# Patient Record
Sex: Female | Born: 1935 | Race: Black or African American | Hispanic: No | Marital: Single | Smoking: Never smoker
Health system: Southern US, Community
[De-identification: ages and names within clinical notes are randomized; demographics above are authoritative.]

## PROBLEM LIST (undated history)

## (undated) DIAGNOSIS — I700 Atherosclerosis of aorta: Secondary | ICD-10-CM

## (undated) DIAGNOSIS — M1990 Unspecified osteoarthritis, unspecified site: Secondary | ICD-10-CM

## (undated) DIAGNOSIS — I10 Essential (primary) hypertension: Secondary | ICD-10-CM

## (undated) DIAGNOSIS — I5030 Unspecified diastolic (congestive) heart failure: Secondary | ICD-10-CM

## (undated) DIAGNOSIS — E039 Hypothyroidism, unspecified: Secondary | ICD-10-CM

## (undated) HISTORY — PX: MULTIPLE TOOTH EXTRACTIONS: SHX2053

---

## 1999-10-13 ENCOUNTER — Other Ambulatory Visit: Admission: RE | Admit: 1999-10-13 | Discharge: 1999-10-13 | Payer: Self-pay | Admitting: Obstetrics and Gynecology

## 1999-10-15 ENCOUNTER — Other Ambulatory Visit: Admission: RE | Admit: 1999-10-15 | Discharge: 1999-10-15 | Payer: Self-pay | Admitting: *Deleted

## 2000-04-16 ENCOUNTER — Ambulatory Visit (HOSPITAL_COMMUNITY): Admission: RE | Admit: 2000-04-16 | Discharge: 2000-04-16 | Payer: Self-pay | Admitting: *Deleted

## 2004-09-18 ENCOUNTER — Encounter: Admission: RE | Admit: 2004-09-18 | Discharge: 2004-09-18 | Payer: Self-pay | Admitting: Internal Medicine

## 2017-08-01 ENCOUNTER — Emergency Department (HOSPITAL_COMMUNITY): Payer: Medicare Other

## 2017-08-01 ENCOUNTER — Encounter (HOSPITAL_COMMUNITY): Payer: Self-pay | Admitting: Emergency Medicine

## 2017-08-01 ENCOUNTER — Inpatient Hospital Stay (HOSPITAL_COMMUNITY)
Admission: EM | Admit: 2017-08-01 | Discharge: 2017-08-03 | DRG: 292 | Disposition: A | Discharge status: Home / Self Care | Payer: Medicare Other | Attending: Oncology | Admitting: Oncology

## 2017-08-01 DIAGNOSIS — M7989 Other specified soft tissue disorders: Secondary | ICD-10-CM

## 2017-08-01 DIAGNOSIS — M1990 Unspecified osteoarthritis, unspecified site: Secondary | ICD-10-CM | POA: Diagnosis present

## 2017-08-01 DIAGNOSIS — M19012 Primary osteoarthritis, left shoulder: Secondary | ICD-10-CM | POA: Diagnosis present

## 2017-08-01 DIAGNOSIS — J849 Interstitial pulmonary disease, unspecified: Secondary | ICD-10-CM | POA: Diagnosis present

## 2017-08-01 DIAGNOSIS — I110 Hypertensive heart disease with heart failure: Principal | ICD-10-CM | POA: Diagnosis present

## 2017-08-01 DIAGNOSIS — Z993 Dependence on wheelchair: Secondary | ICD-10-CM

## 2017-08-01 DIAGNOSIS — M19011 Primary osteoarthritis, right shoulder: Secondary | ICD-10-CM | POA: Diagnosis present

## 2017-08-01 DIAGNOSIS — I10 Essential (primary) hypertension: Secondary | ICD-10-CM

## 2017-08-01 DIAGNOSIS — I480 Paroxysmal atrial fibrillation: Secondary | ICD-10-CM | POA: Diagnosis not present

## 2017-08-01 DIAGNOSIS — I700 Atherosclerosis of aorta: Secondary | ICD-10-CM | POA: Diagnosis not present

## 2017-08-01 DIAGNOSIS — I5033 Acute on chronic diastolic (congestive) heart failure: Secondary | ICD-10-CM | POA: Diagnosis present

## 2017-08-01 DIAGNOSIS — Z87891 Personal history of nicotine dependence: Secondary | ICD-10-CM

## 2017-08-01 DIAGNOSIS — R600 Localized edema: Secondary | ICD-10-CM

## 2017-08-01 DIAGNOSIS — E039 Hypothyroidism, unspecified: Secondary | ICD-10-CM | POA: Diagnosis present

## 2017-08-01 DIAGNOSIS — E785 Hyperlipidemia, unspecified: Secondary | ICD-10-CM | POA: Diagnosis present

## 2017-08-01 DIAGNOSIS — Z7982 Long term (current) use of aspirin: Secondary | ICD-10-CM

## 2017-08-01 DIAGNOSIS — R079 Chest pain, unspecified: Secondary | ICD-10-CM | POA: Diagnosis present

## 2017-08-01 DIAGNOSIS — I2510 Atherosclerotic heart disease of native coronary artery without angina pectoris: Secondary | ICD-10-CM | POA: Diagnosis present

## 2017-08-01 DIAGNOSIS — D649 Anemia, unspecified: Secondary | ICD-10-CM | POA: Diagnosis present

## 2017-08-01 DIAGNOSIS — I272 Pulmonary hypertension, unspecified: Secondary | ICD-10-CM | POA: Diagnosis present

## 2017-08-01 DIAGNOSIS — I5031 Acute diastolic (congestive) heart failure: Secondary | ICD-10-CM | POA: Diagnosis present

## 2017-08-01 DIAGNOSIS — Z791 Long term (current) use of non-steroidal anti-inflammatories (NSAID): Secondary | ICD-10-CM

## 2017-08-01 HISTORY — DX: Atherosclerosis of aorta: I70.0

## 2017-08-01 HISTORY — DX: Essential (primary) hypertension: I10

## 2017-08-01 HISTORY — DX: Unspecified osteoarthritis, unspecified site: M19.90

## 2017-08-01 LAB — BASIC METABOLIC PANEL
ANION GAP: 10 (ref 5–15)
BUN: 17 mg/dL (ref 6–20)
CALCIUM: 9.9 mg/dL (ref 8.9–10.3)
CO2: 27 mmol/L (ref 22–32)
Chloride: 104 mmol/L (ref 101–111)
Creatinine, Ser: 0.51 mg/dL (ref 0.44–1.00)
GFR calc non Af Amer: >60 mL/min (ref >60)
Glucose, Bld: 83 mg/dL (ref 65–99)
Potassium: 3.3 mmol/L — ABNORMAL LOW (ref 3.5–5.1)
SODIUM: 141 mmol/L (ref 135–145)

## 2017-08-01 LAB — URINALYSIS, ROUTINE W REFLEX MICROSCOPIC
BILIRUBIN URINE: NEGATIVE
Glucose, UA: NEGATIVE mg/dL
Hgb urine dipstick: NEGATIVE
KETONES UR: 15 mg/dL — AB
LEUKOCYTES UA: NEGATIVE
NITRITE: NEGATIVE
PH: 5.5 (ref 5.0–8.0)
PROTEIN: NEGATIVE mg/dL
Specific Gravity, Urine: 1.015 (ref 1.005–1.030)

## 2017-08-01 LAB — CBC WITH DIFFERENTIAL/PLATELET
BASOS ABS: 0 10*3/uL (ref 0.0–0.1)
BASOS PCT: 1 %
EOS ABS: 0.3 10*3/uL (ref 0.0–0.7)
Eosinophils Relative: 6 %
HEMATOCRIT: 31.9 % — AB (ref 36.0–46.0)
HEMOGLOBIN: 10.4 g/dL — AB (ref 12.0–15.0)
LYMPHS ABS: 1.5 10*3/uL (ref 0.7–4.0)
Lymphocytes Relative: 26 %
MCH: 30.5 pg (ref 26.0–34.0)
MCHC: 32.6 g/dL (ref 30.0–36.0)
MCV: 93.5 fL (ref 78.0–100.0)
MONO ABS: 0.6 10*3/uL (ref 0.1–1.0)
MONOS PCT: 10 %
NEUTROS PCT: 57 %
Neutro Abs: 3.4 10*3/uL (ref 1.7–7.7)
Platelets: 407 10*3/uL — ABNORMAL HIGH (ref 150–400)
RBC: 3.41 MIL/uL — ABNORMAL LOW (ref 3.87–5.11)
RDW: 16.4 % — AB (ref 11.5–15.5)
WBC: 5.9 10*3/uL (ref 4.0–10.5)

## 2017-08-01 LAB — BRAIN NATRIURETIC PEPTIDE: B Natriuretic Peptide: 31.4 pg/mL (ref 0.0–100.0)

## 2017-08-01 LAB — I-STAT TROPONIN, ED: TROPONIN I, POC: 0 ng/mL (ref 0.00–0.08)

## 2017-08-01 LAB — SEDIMENTATION RATE: SED RATE: 48 mm/h — AB (ref 0–22)

## 2017-08-01 LAB — TSH: TSH: 4.009 u[IU]/mL (ref 0.350–4.500)

## 2017-08-01 LAB — TROPONIN I: Troponin I: <0.03 ng/mL (ref <0.03)

## 2017-08-01 MED ORDER — METOPROLOL SUCCINATE ER 25 MG PO TB24
25.0000 mg | ORAL_TABLET | Freq: Every day | ORAL | Status: DC
  Administered 2017-08-02 – 2017-08-03 (×2): 25 mg via ORAL
  Filled 2017-08-01 (×2): qty 1

## 2017-08-01 MED ORDER — ROSUVASTATIN CALCIUM 10 MG PO TABS
10.0000 mg | ORAL_TABLET | Freq: Every day | ORAL | Status: DC
Start: 2017-08-02 — End: 2017-08-03
  Administered 2017-08-02: 10 mg via ORAL
  Filled 2017-08-01: qty 1

## 2017-08-01 MED ORDER — IOPAMIDOL (ISOVUE-370) INJECTION 76%
INTRAVENOUS | Status: AC
  Administered 2017-08-01: 100 mL
  Filled 2017-08-01: qty 100

## 2017-08-01 MED ORDER — ACETAMINOPHEN 325 MG PO TABS
650.0000 mg | ORAL_TABLET | Freq: Four times a day (QID) | ORAL | Status: DC | PRN
  Administered 2017-08-02: 650 mg via ORAL
  Filled 2017-08-01: qty 2

## 2017-08-01 MED ORDER — ENOXAPARIN SODIUM 40 MG/0.4ML ~~LOC~~ SOLN
40.0000 mg | SUBCUTANEOUS | Status: DC
  Administered 2017-08-01 – 2017-08-02 (×2): 40 mg via SUBCUTANEOUS
  Filled 2017-08-01 (×3): qty 0.4

## 2017-08-01 MED ORDER — OXYBUTYNIN CHLORIDE 5 MG PO TABS
5.0000 mg | ORAL_TABLET | Freq: Every day | ORAL | Status: DC
  Administered 2017-08-02 – 2017-08-03 (×2): 5 mg via ORAL
  Filled 2017-08-01 (×2): qty 1

## 2017-08-01 MED ORDER — POTASSIUM CHLORIDE CRYS ER 20 MEQ PO TBCR
40.0000 meq | EXTENDED_RELEASE_TABLET | Freq: Once | ORAL | Status: AC
  Administered 2017-08-01: 40 meq via ORAL
  Filled 2017-08-01: qty 2

## 2017-08-01 MED ORDER — ENALAPRIL MALEATE 20 MG PO TABS
20.0000 mg | ORAL_TABLET | Freq: Two times a day (BID) | ORAL | Status: DC
  Administered 2017-08-02 – 2017-08-03 (×4): 20 mg via ORAL
  Filled 2017-08-01 (×5): qty 1

## 2017-08-01 MED ORDER — ASPIRIN 81 MG PO CHEW
81.0000 mg | CHEWABLE_TABLET | Freq: Every day | ORAL | Status: DC
  Administered 2017-08-01 – 2017-08-03 (×3): 81 mg via ORAL
  Filled 2017-08-01 (×3): qty 1

## 2017-08-01 MED ORDER — LEVOTHYROXINE SODIUM 75 MCG PO TABS
75.0000 ug | ORAL_TABLET | Freq: Every day | ORAL | Status: DC
Start: 2017-08-02 — End: 2017-08-02
  Filled 2017-08-01: qty 1

## 2017-08-01 MED ORDER — NITROGLYCERIN 0.4 MG SL SUBL: 0.4000 mg | SUBLINGUAL_TABLET | SUBLINGUAL | Status: DC | PRN

## 2017-08-01 MED ORDER — FUROSEMIDE 10 MG/ML IJ SOLN
40.0000 mg | Freq: Two times a day (BID) | INTRAMUSCULAR | Status: DC
  Administered 2017-08-01: 40 mg via INTRAVENOUS
  Filled 2017-08-01: qty 4

## 2017-08-01 NOTE — ED Provider Notes (Signed)
[facility]-EMERGENCY DEPT Provider Note   CSN: [id number] Arrival date & time: 08/01/17  1411     History   Chief Complaint Chief Complaint  Patient presents with  . Leg Swelling      HPI Traci Meyer is a 83 y.o. Female With a history of HTN, HLD who presents with complaints of 2 weeks of left-sided chest pain and right lower extremity swelling and pain. Patient is currently living with her daughters, visiting from 2 hours away. She is nonambulatory at baseline, uses a wheelchair due to arthritis. She reports she's been having intermittent left-sided dull chest pain, nonradiating. She denies any associated shortness of breath, lightheadedness, nausea, diaphoresis. It occurs regardless of exertion. She is also noted right lower extremity swelling and pain. She reports she has intermittent bilateral lower extremity swelling, but this is much more severe. She denies any fevers or chills. She denies any recent falls or trauma.  Patient denies any previous cardiac history. She denies any history of blood clots. He takes baby aspirin daily.  Past Medical History:  Diagnosis Date  . Hypertension     Patient Active Problem List   Diagnosis Date Noted  . Chest pain 08/01/2017  . Bilateral leg edema 08/01/2017  . Arthritis 08/01/2017  . Hypertension 08/01/2017  . Aortic atherosclerosis (HCC) 08/01/2017    History reviewed. No pertinent surgical history.  OB History    No data available       Home Medications    Prior to Admission medications   Medication Sig Start Date End Date Taking? Authorizing Provider  aspirin 81 MG chewable tablet Chew by mouth daily.   Yes [provider]  enalapril (VASOTEC) 20 MG tablet Take 20 mg by mouth 2 (two) times daily.   Yes [provider]  hydrochlorothiazide (MICROZIDE) 12.5 MG capsule Take 12.5 mg by mouth daily.   Yes [provider]  levothyroxine (SYNTHROID, LEVOTHROID) 75 MCG tablet Take 75 mcg by mouth daily  before breakfast.   Yes [provider]  meloxicam (MOBIC) 15 MG tablet Take 15 mg by mouth daily.   Yes [provider]  metoprolol succinate (TOPROL-XL) 25 MG 24 hr tablet Take 25 mg by mouth daily.   Yes [provider]  oxybutynin (DITROPAN) 5 MG tablet Take 5 mg by mouth daily.   Yes [provider]  rosuvastatin (CRESTOR) 10 MG tablet Take 10 mg by mouth daily.   Yes [provider]    Family History Family History  Problem Relation Age of Onset  . Heart disease Neg Hx     Social History Social History  Substance Use Topics  . Smoking status: Former Games developer  . Smokeless tobacco: Never Used     Comment: 45 years ago briefly  . Alcohol use No     Allergies   Patient has no allergy information on record.   Review of Systems Review of Systems  Constitutional: Negative for chills and fever.  HENT: Negative for ear pain and sore throat.   Eyes: Negative for pain and visual disturbance.  Respiratory: Negative for cough and shortness of breath.   Cardiovascular: Positive for chest pain and leg swelling (right leg swelling). Negative for palpitations.  Gastrointestinal: Negative for abdominal pain and vomiting.  Genitourinary: Negative for dysuria and hematuria.  Musculoskeletal: Negative for arthralgias and back pain.  Skin: Negative for color change and rash.  Neurological: Negative for seizures and syncope.  All other systems reviewed and are negative.  Physical Exam Updated Vital Signs BP 127/67 (BP Location: Left Arm)   Pulse 88   Temp 98.2 F (36.8 C) (Oral)   Resp 20   Ht 5\' 3"  (1.6 m)   Wt 92.2 kg (203 lb 4.8 oz) Comment: bed scale; pt states can not stand  SpO2 98%   BMI 36.01 kg/m   Physical Exam  Constitutional: She appears well-developed and well-nourished. No distress.  HENT:  Head: Normocephalic and atraumatic.  Eyes: Conjunctivae are normal.  Neck: Neck supple.  Cardiovascular: Normal rate and  regular rhythm.   No murmur heard. Pulmonary/Chest: Effort normal and breath sounds normal. No respiratory distress.  Abdominal: Soft. There is no tenderness.  Musculoskeletal: She exhibits edema (right lower extremity with diffuse pitting edema and ttp, increased warmth when compared to left) and tenderness.  Neurological: She is alert.  Skin: Skin is warm and dry.  Psychiatric: She has a normal mood and affect.  Nursing note and vitals reviewed.    ED Treatments / Results  Labs (all labs ordered are listed, but only abnormal results are displayed) Labs Reviewed  CBC WITH DIFFERENTIAL/PLATELET - Abnormal; Notable for the following:       Result Value   RBC 3.41 (*)    Hemoglobin 10.4 (*)    HCT 31.9 (*)    RDW 16.4 (*)    Platelets 407 (*)    All other components within normal limits  BASIC METABOLIC PANEL - Abnormal; Notable for the following:    Potassium 3.3 (*)    All other components within normal limits  URINALYSIS, ROUTINE W REFLEX MICROSCOPIC - Abnormal; Notable for the following:    Ketones, ur 15 (*)    All other components within normal limits  SEDIMENTATION RATE - Abnormal; Notable for the following:    Sed Rate 48 (*)    All other components within normal limits  TSH  BRAIN NATRIURETIC PEPTIDE  TROPONIN I  TROPONIN I  COMPREHENSIVE METABOLIC PANEL  I-STAT TROPONIN, ED    EKG  EKG Interpretation  Date/Time:  Sunday August 01 2017 15:34:30 EDT Ventricular Rate:  63 PR Interval:    QRS Duration: 106 QT Interval:  425 QTC Calculation: 435 R Axis:   -22 Text Interpretation:  Sinus rhythm Atrial premature complexes Borderline left axis deviation Minimal ST elevation, anterior leads No old tracing to compare Confirmed by Rolan BuccoBelfi, Melanie [phone]([id number]) on 08/01/2017 3:38:36 PM       Radiology Ct Angio Chest Pe W Or Wo Contrast  Result Date: 08/01/2017 CLINICAL DATA:  Chest pain and leg swelling x2 weeks. EXAM: CT ANGIOGRAPHY CHEST WITH CONTRAST TECHNIQUE:  Multidetector CT imaging of the chest was performed using the standard protocol during bolus administration of intravenous contrast. Multiplanar CT image reconstructions and MIPs were obtained to evaluate the vascular anatomy. CONTRAST:  100 cc Isovue 370 COMPARISON:  Same day CXR FINDINGS: Cardiovascular: Limited pulmonary arterial opacification, assessment beyond the proximal pulmonary segmental level is compromised due to timing of contrast and body habitus. No large central pulmonary embolus is noted. Dilated main pulmonary artery to 4.2 cm that may represent stigmata of chronic pulmonary hypertension. Cardiomegaly with trace pericardial effusion. Coronary arteriosclerosis is noted. Normal branch pattern of the great vessels. There is minimal aortic atherosclerosis without dissection or aneurysm. Mediastinum/Nodes: No enlarged mediastinal, hilar, or axillary lymph nodes. Thyroid gland, trachea, and esophagus demonstrate no significant findings. Lungs/Pleura: No pneumothorax or effusion. Patchy ground-glass opacities are noted bilaterally, nonspecific in etiology but may reflect areas of  hypoventilatory change, fluid in alveoli or possibly alveolitis/pneumonitis among some possibilities though not exclusive. No pneumonic consolidation. Atelectasis noted in the lingula and right lower lobe. No dominant mass is identified. Upper Abdomen: No acute abnormality. Musculoskeletal: Osteoarthritic joint space narrowing and subchondral cystic change of both glenohumeral joints. Thoracic spondylosis. No apparent osteolytic or blastic disease. Review of the MIP images confirms the above findings. IMPRESSION: 1. Limited pulmonary arterial opacification beyond the proximal segmental level. No large central pulmonary embolus is identified. 2. Dilatation of the main pulmonary artery to 4.2 cm consistent with a component of pulmonary hypertension. 3. Cardiomegaly with coronary arteriosclerosis is noted. 4. Minimal aortic  atherosclerosis without aneurysm or dissection. 5. Thoracolumbar spondylosis and bilateral glenohumeral joint osteoarthritis. 6. Faint nonspecific ground-glass opacities bilaterally which may reflect hypoventilatory change or possibly alveolitis/pneumonitis among some possibilities though exclusive. Aortic Atherosclerosis (ICD10-I70.0). Electronically Signed   By: Tollie Ethavid  Kwon M.D.   On: 08/01/2017 17:40   Dg Chest Portable 1 View  Result Date: 08/01/2017 CLINICAL DATA:  ; Pt has had bilateral leg edema x 2 weeks associated with a dull nagging CP EXAM: PORTABLE CHEST 1 VIEW COMPARISON:  None. FINDINGS: Mild cardiomegaly. Lungs are clear. No pleural effusion or pneumothorax. Degenerative changes at both shoulders, moderate to severe in degree. No acute or suspicious osseous finding. IMPRESSION: 1. No active disease.  No evidence of pneumonia or pulmonary edema. 2. Cardiomegaly. Electronically Signed   By: Bary RichardStan  Maynard M.D.   On: 08/01/2017 16:07    Procedures Procedures (including critical care time)  Medications Ordered in ED Medications  metoprolol succinate (TOPROL-XL) 24 hr tablet 25 mg (not administered)  oxybutynin (DITROPAN) tablet 5 mg (not administered)  rosuvastatin (CRESTOR) tablet 10 mg (not administered)  enalapril (VASOTEC) tablet 20 mg (not administered)  aspirin chewable tablet 81 mg (81 mg Oral Given 08/01/17 2328)  enoxaparin (LOVENOX) injection 40 mg (40 mg Subcutaneous Given 08/01/17 2329)  furosemide (LASIX) injection 40 mg (40 mg Intravenous Given 08/01/17 2329)  acetaminophen (TYLENOL) tablet 650 mg (not administered)  nitroGLYCERIN (NITROSTAT) SL tablet 0.4 mg (not administered)  levothyroxine (SYNTHROID, LEVOTHROID) tablet 75 mcg (not administered)  iopamidol (ISOVUE-370) 76 % injection (100 mLs  Contrast Given 08/01/17 1701)  potassium chloride SA (K-DUR,KLOR-CON) CR tablet 40 mEq (40 mEq Oral Given 08/01/17 2328)     Initial Impression / Assessment and Plan / ED Course  I  have reviewed the triage vital signs and the nursing notes.  Pertinent labs & imaging results that were available during my care of the patient were reviewed by me and considered in my medical decision making (see chart for details).     Patient arrived hypertensive, otherwise hemodynamically stable in no acute distress. Exam is above, significant for overall deconditioning, and significant right lower extremity pitting edema and tenderness.  Basic labs and imaging obtained. Bedside ultrasound performed by myself negative for obvious right lower extremity deep venous thrombosis. Initial troponin negative. CT chest negative for PE.  EKG without ischemic changes, however patient seems to be going in and out of atrial fibrillation. No previous diagnosis.  Given risk factors, patient needs further stress testing for rule out ischemia. Discussed with hospitalist for admission. Patient in agreement with plan at time of admission.  Patient and plan of care discussed with Attending physician, Dr. Hyacinth MeekerMiller.    Final Clinical Impressions(s) / ED Diagnoses   Final diagnoses:  Chest pain, unspecified type  Paroxysmal atrial fibrillation (HCC)  Leg swelling    New Prescriptions Current  Discharge Medication List       Wynelle Cleveland, MD 08/02/17 Ollen Bowl    Eber Hong, MD 08/02/17 1350

## 2017-08-01 NOTE — ED Provider Notes (Signed)
I saw and evaluated the patient, reviewed the resident's note and I agree with the findings and plan.  Pertinent History: living with kids - has dull L sided CP - RLE swelling X 2 weeks, has swelling normally - more today on the R - no cardiac history - at baseline otherwise - totally WC bound.    Pertinent Exam findings: possible irregular rhythm, RLE edema - pitting to the thigh - with ttp and warmth and calf ttp.    R/o PE / DVT - ? New onset arrhythmia / CHF,  Labs, CTA   EKG Interpretation  Date/Time:  Sunday August 01 2017 15:34:30 EDT Ventricular Rate:  63 PR Interval:    QRS Duration: 106 QT Interval:  425 QTC Calculation: 435 R Axis:   -22 Text Interpretation:  Sinus rhythm Atrial premature complexes Borderline left axis deviation Minimal ST elevation, anterior leads No old tracing to compare Confirmed by Rolan BuccoBelfi, Melanie [phone]) on 08/01/2017 3:38:36 PM        ED ECG REPORT  I personally interpreted this EKG   Date: 08/02/2017   Rate: 63  Rhythm: normal sinus rhythm  QRS Axis: left  Intervals: normal  ST/T Wave abnormalities: nonspecific T wave changes  Conduction Disutrbances:none  Narrative Interpretation:   Old EKG Reviewed: none available   Final diagnoses:  Chest pain, unspecified type  Paroxysmal atrial fibrillation (HCC)  Leg swelling      Eber HongMiller, Yeimy Brabant, MD 08/02/17 1349

## 2017-08-01 NOTE — H&P (Signed)
Date: 08/01/2017               Patient Name:  Traci DivineDorothy P Vivar MRN: [id number]  DOB: 1931/04/17 Age / Sex: 83 y.o., female   PCP: System, Pcp Not In         Medical Service: Internal Medicine Teaching Service         Attending Physician: Dr. Cyndie ChimeGranfortuna, Genene ChurnJames M, MD    First Contact: Dr. Landis MartinsLacroce, Samantha  Pager: [phone]  Second Contact: Dr. Gwynn BurlyWallace, Andrew Pager: [phone]       After Hours (After 5p/  First Contact Pager: [phone]  weekends / holidays): Second Contact Pager: [phone]   Chief Complaint: chest pain, lower extremity edema, pain  History of Present Illness: 83 yo female with pmh of HTN, Hypohyroidism, HLD, Osteoarthritis, presents with 2 weeks of chest pain and leg swelling bilaterally.  She describes the pain as dull, mild, non reproducible, non pleuritic, radiating to left arm and intermittent lasting 30 minutes.  The pain is not associated with SOB, diaphoresis, palpitations, nausea, vomiting.  There are no aggravating or alleviating factors.  Pt denies history of heart or lung disease, GERD or GI disease.  Pt admits to taking her home medications intermittently and hasn't taken any since Thursday due to feeling like they don't work.  Pt says she came in now because of the swelling in her legs causing pain mainly in her right leg.  ED course: troponin negative, chest x ray neg, urinalysis neg, CBC neg,  ECG shows irregular rhythm APCs, Doppler ultrasound negative for DVT bilaterally, CT angio shows no PE but Pulm HTN, coronary arteriosclerosis, ground glass opacities throughout lung fields,   Meds:  Current Meds  Medication Sig  . aspirin 81 MG chewable tablet Chew by mouth daily.  . enalapril (VASOTEC) 20 MG tablet Take 20 mg by mouth 2 (two) times daily.  . hydrochlorothiazide (MICROZIDE) 12.5 MG capsule Take 12.5 mg by mouth daily.  Marland Kitchen. levothyroxine (SYNTHROID, LEVOTHROID) 75 MCG tablet Take 75 mcg by mouth daily before breakfast.  . meloxicam (MOBIC) 15 MG  tablet Take 15 mg by mouth daily.  . metoprolol succinate (TOPROL-XL) 25 MG 24 hr tablet Take 25 mg by mouth daily.  Marland Kitchen. oxybutynin (DITROPAN) 5 MG tablet Take 5 mg by mouth daily.  . rosuvastatin (CRESTOR) 10 MG tablet Take 10 mg by mouth daily.     Allergies: Allergies as of 08/01/2017  . (Not on File)   Past Medical History:  Diagnosis Date  . Hypertension     Family History: pt denies family history of heart disease and can't remember any other chronic medical diseases in her family  Social History: Pt is a never smoker, non alcohol user or illicit drug user  Review of Systems: Review of Systems  Constitutional: Negative for chills, fever and weight loss.  HENT: Negative for ear pain, hearing loss and tinnitus.   Eyes: Negative for blurred vision, double vision and photophobia.  Respiratory: Negative for cough, hemoptysis, shortness of breath and wheezing.   Cardiovascular: Positive for chest pain and leg swelling. Negative for palpitations and orthopnea.  Gastrointestinal: Negative for abdominal pain, diarrhea, heartburn, nausea and vomiting.  Genitourinary: Positive for frequency and urgency. Negative for dysuria and flank pain.  Musculoskeletal: Positive for joint pain. Negative for myalgias and neck pain.  Skin: Negative for itching and rash.  Neurological: Negative for dizziness, tingling, tremors, sensory change and headaches.  Endo/Heme/Allergies: Does not bruise/bleed easily.  Psychiatric/Behavioral: Negative for  depression, substance abuse and suicidal ideas.    Physical Exam: Blood pressure (!) 168/77, pulse 60, temperature 98 F (36.7 C), temperature source Oral, resp. rate (!) 26, height 5\' 3"  (1.6 m), weight 250 lb (113.4 kg), SpO2 95 %. Physical Exam  Constitutional: She is oriented to person, place, and time. She appears well-developed and well-nourished. No distress.  HENT:  Head: Normocephalic and atraumatic.  Neck: JVD (minimal just above clavicle )  present. No thyromegaly present.  Cardiovascular: Normal rate.  An irregular rhythm present. Frequent extrasystoles are present. Exam reveals no gallop.   Murmur heard.  Decrescendo systolic murmur is present with a grade of 1/6  Pulmonary/Chest: Effort normal. No respiratory distress. She has no wheezes. She has rales (bibasilar).  Abdominal: Soft. She exhibits no distension. There is no tenderness.  Musculoskeletal: She exhibits edema (bilateral 2+ LE edema) and tenderness (tender in right hip to palpation).  Neurological: She is alert and oriented to person, place, and time.  Skin: Skin is warm and dry. Capillary refill takes less than 2 seconds. No rash noted. There is erythema (erythema on bilateral lower extremities mainly on the medial side of lower legs). No pallor.  Psychiatric: She has a normal mood and affect. Her behavior is normal.    EKG: personally reviewed my interpretation is regular rate, left axis deviation, 1st degree av block with APC's  CXR: personally reviewed my interpretation is cardiomegaly, no pulmonary edema, infiltrate or consolidation   Lab Orders Placed     CBC with Differential     Basic metabolic panel     Urinalysis, Routine w reflex microscopic     TSH     Brain natriuretic peptide     Sedimentation rate     Troponin I     Basic metabolic panel     I-Stat Troponin, ED [facility]) Assessment & Plan by Problem: Principal Problem:   Chest pain Active Problems:   Bilateral leg edema   Arthritis   Hypertension   Aortic atherosclerosis (HCC)  83 yo female with PMH of HTN, HLD, arthritis, presents with 2 weeks of intermittent dull chest pain that radiates to left arm and bilateral lower extremity leg edema.    Chest pain: based on clinical picture less likely to be ACS but ruling out, More in line to think this is heart failure and possibly heart strain, bibasilar rales, PHTN shown on CT angio, LE edema however BNP 31, cannot exclude arrhythmia as  the culprit.  -Telemetry -repeat troponins -ECHO complete ordered -Repeat ECG in am  -SL nitroglycerin PRN  Bilateral LE edema: began two weeks ago per pt, recently started causing pain in pt's right leg, kidney function normal, DVT neg on ultrasound, most likely cause is heart failure  -Lasix 40mg  goal of 2L diuresis -Daily weights -Strict I's and O's -CMP in am to check liver function and albumin level  Arthritis: chronic issue, pt now wheelchair bound  -Treat pain during stay, pt not in much pain will give Acetaminophen as needed -Sed rate to rule out inflammatory process  HTN: BP 140/72 on last read  -will restart home enalapril 20mg  -Holding metoprolol due to pt having normal rate not being overly hypertensive and this will give us time to better understand pts arrythmia before starting any medications that will affect it.   Dispo: Admit patient to Inpatient with expected length of stay greater than 2 midnights.  Signed: Angelita InglesWinfrey, Jahzir Strohmeier B, MD 08/01/2017, 9:29 PM  Thornell MuleBrandon Teddie Mehta MD PGY-1  Internal Medicine Pager # [phone]

## 2017-08-01 NOTE — ED Triage Notes (Signed)
Per EMS; Pt has had bilateral leg edema x 2 weeks associated with a dull nagging CP.  Per Pt she has not cardiac history.  Pt VSS, A&Ox4, and EKG NSR.

## 2017-08-01 NOTE — ED Notes (Signed)
Pt c/o pain in right leg. Applied heat packs to right leg for comfort.

## 2017-08-02 ENCOUNTER — Observation Stay (HOSPITAL_BASED_OUTPATIENT_CLINIC_OR_DEPARTMENT_OTHER): Payer: Medicare Other

## 2017-08-02 DIAGNOSIS — Z79899 Other long term (current) drug therapy: Secondary | ICD-10-CM | POA: Diagnosis not present

## 2017-08-02 DIAGNOSIS — J849 Interstitial pulmonary disease, unspecified: Secondary | ICD-10-CM | POA: Diagnosis present

## 2017-08-02 DIAGNOSIS — I2510 Atherosclerotic heart disease of native coronary artery without angina pectoris: Secondary | ICD-10-CM | POA: Diagnosis present

## 2017-08-02 DIAGNOSIS — Z791 Long term (current) use of non-steroidal anti-inflammatories (NSAID): Secondary | ICD-10-CM | POA: Diagnosis not present

## 2017-08-02 DIAGNOSIS — I272 Pulmonary hypertension, unspecified: Secondary | ICD-10-CM | POA: Diagnosis present

## 2017-08-02 DIAGNOSIS — E785 Hyperlipidemia, unspecified: Secondary | ICD-10-CM | POA: Diagnosis present

## 2017-08-02 DIAGNOSIS — M19011 Primary osteoarthritis, right shoulder: Secondary | ICD-10-CM | POA: Diagnosis present

## 2017-08-02 DIAGNOSIS — I110 Hypertensive heart disease with heart failure: Secondary | ICD-10-CM | POA: Diagnosis present

## 2017-08-02 DIAGNOSIS — M1990 Unspecified osteoarthritis, unspecified site: Secondary | ICD-10-CM | POA: Diagnosis not present

## 2017-08-02 DIAGNOSIS — I361 Nonrheumatic tricuspid (valve) insufficiency: Secondary | ICD-10-CM

## 2017-08-02 DIAGNOSIS — I5031 Acute diastolic (congestive) heart failure: Secondary | ICD-10-CM | POA: Diagnosis present

## 2017-08-02 DIAGNOSIS — R079 Chest pain, unspecified: Secondary | ICD-10-CM | POA: Diagnosis not present

## 2017-08-02 DIAGNOSIS — Z993 Dependence on wheelchair: Secondary | ICD-10-CM | POA: Diagnosis not present

## 2017-08-02 DIAGNOSIS — E039 Hypothyroidism, unspecified: Secondary | ICD-10-CM | POA: Diagnosis present

## 2017-08-02 DIAGNOSIS — Z7982 Long term (current) use of aspirin: Secondary | ICD-10-CM | POA: Diagnosis not present

## 2017-08-02 DIAGNOSIS — Z87891 Personal history of nicotine dependence: Secondary | ICD-10-CM | POA: Diagnosis not present

## 2017-08-02 DIAGNOSIS — I872 Venous insufficiency (chronic) (peripheral): Secondary | ICD-10-CM | POA: Diagnosis not present

## 2017-08-02 DIAGNOSIS — I480 Paroxysmal atrial fibrillation: Secondary | ICD-10-CM | POA: Diagnosis present

## 2017-08-02 DIAGNOSIS — I5030 Unspecified diastolic (congestive) heart failure: Secondary | ICD-10-CM | POA: Diagnosis not present

## 2017-08-02 DIAGNOSIS — D649 Anemia, unspecified: Secondary | ICD-10-CM | POA: Diagnosis present

## 2017-08-02 DIAGNOSIS — I700 Atherosclerosis of aorta: Secondary | ICD-10-CM | POA: Diagnosis present

## 2017-08-02 DIAGNOSIS — M19012 Primary osteoarthritis, left shoulder: Secondary | ICD-10-CM | POA: Diagnosis present

## 2017-08-02 LAB — COMPREHENSIVE METABOLIC PANEL
ALBUMIN: 3.5 g/dL (ref 3.5–5.0)
ALK PHOS: 72 U/L (ref 38–126)
ALT: 9 U/L — AB (ref 14–54)
AST: 18 U/L (ref 15–41)
Anion gap: 9 (ref 5–15)
BUN: 8 mg/dL (ref 6–20)
CALCIUM: 9.6 mg/dL (ref 8.9–10.3)
CO2: 28 mmol/L (ref 22–32)
CREATININE: 0.46 mg/dL (ref 0.44–1.00)
Chloride: 102 mmol/L (ref 101–111)
GFR calc non Af Amer: >60 mL/min (ref >60)
GLUCOSE: 97 mg/dL (ref 65–99)
Potassium: 3.3 mmol/L — ABNORMAL LOW (ref 3.5–5.1)
SODIUM: 139 mmol/L (ref 135–145)
Total Bilirubin: 0.8 mg/dL (ref 0.3–1.2)
Total Protein: 6.6 g/dL (ref 6.5–8.1)

## 2017-08-02 LAB — FERRITIN: FERRITIN: 37 ng/mL (ref 11–307)

## 2017-08-02 LAB — RETICULOCYTES
RBC.: 3.34 MIL/uL — ABNORMAL LOW (ref 3.87–5.11)
RETIC COUNT ABSOLUTE: 60.1 10*3/uL (ref 19.0–186.0)
RETIC CT PCT: 1.8 % (ref 0.4–3.1)

## 2017-08-02 LAB — ECHOCARDIOGRAM COMPLETE
Height: 63 in
Weight: 3193.6 oz

## 2017-08-02 LAB — BASIC METABOLIC PANEL
Anion gap: 10 (ref 5–15)
BUN: 9 mg/dL (ref 6–20)
CO2: 27 mmol/L (ref 22–32)
CREATININE: 0.45 mg/dL (ref 0.44–1.00)
Calcium: 9.5 mg/dL (ref 8.9–10.3)
Chloride: 104 mmol/L (ref 101–111)
GFR calc Af Amer: >60 mL/min (ref >60)
Glucose, Bld: 86 mg/dL (ref 65–99)
Potassium: 3.6 mmol/L (ref 3.5–5.1)
SODIUM: 141 mmol/L (ref 135–145)

## 2017-08-02 LAB — VITAMIN B12: Vitamin B-12: 194 pg/mL (ref 180–914)

## 2017-08-02 LAB — TROPONIN I

## 2017-08-02 LAB — IRON AND TIBC
Iron: 52 ug/dL (ref 28–170)
Saturation Ratios: 21 % (ref 10.4–31.8)
TIBC: 253 ug/dL (ref 250–450)
UIBC: 201 ug/dL

## 2017-08-02 MED ORDER — LEVOTHYROXINE SODIUM 75 MCG PO TABS
75.0000 ug | ORAL_TABLET | Freq: Every day | ORAL | Status: DC
  Administered 2017-08-02 – 2017-08-03 (×2): 75 ug via ORAL
  Filled 2017-08-02 (×2): qty 1

## 2017-08-02 MED ORDER — PERFLUTREN LIPID MICROSPHERE
1.0000 mL | INTRAVENOUS | Status: AC | PRN
  Administered 2017-08-02: 2 mL via INTRAVENOUS
  Filled 2017-08-02: qty 10

## 2017-08-02 NOTE — Progress Notes (Signed)
  Echocardiogram 2D Echocardiogram has been performed.  Traci Meyer 08/02/2017, 1:57 PM

## 2017-08-02 NOTE — Progress Notes (Signed)
Subjective: Patient was seen and examined at bedside today. No acute events overnight. She states that she is still having pain and swelling in her legs (10/10 in severity) although the lasix administered last night help improve her discomfort. Upon exam today she states that she is not currently having any chest pain, diaphoresis, nausea,vomitting, or new-onset weakness.   Additional past medical/ social history was obtained. She denies any prior shortness of breath, pillow orthopnea, paroxysmal nocturnal dyspnea or dyspnea upon exertion. She tells me that her main reason for not moving around is her arthritis. Additionally, she states that she was previously employed by [employer], and worked as a Pharmacist, community Objective: Vital signs in last 24 hours: Vitals:   08/01/17 2210 08/02/17 0145 08/02/17 0443 08/02/17 0450  BP: (!) 158/52 127/67 (!) 99/58   Pulse: 70 88 68   Resp:  20 18   Temp: 97.8 F (36.6 C) 98.2 F (36.8 C) 98.2 F (36.8 C)   TempSrc: Oral Oral Oral   SpO2: 100% 98% 96%   Weight: 92.2 kg (203 lb 4.8 oz)   90.5 kg (199 lb 9.6 oz)  Height: 5' 3"  (1.6 m)      Weight change:   Intake/Output Summary (Last 24 hours) at 08/02/17 1506 Last data filed at 08/02/17 0700  Gross per 24 hour  Intake              360 ml  Output             2275 ml  Net            -1915 ml   BP (!) 99/58 (BP Location: Left Arm)   Pulse 68   Temp 98.2 F (36.8 C) (Oral)   Resp 18   Ht 5' 3"  (1.6 m)   Wt 90.5 kg (199 lb 9.6 oz) Comment: bed scale  SpO2 96%   BMI 35.36 kg/m  General appearance: alert, appears stated age, no distress and moderately obese Head: Normocephalic, without obvious abnormality, atraumatic Lungs: rhonchi bilaterally Heart: regular rate and rhythm, S1, S2 normal, no murmur, click, rub or gallop Abdomen: soft, non-tender; bowel sounds normal; no masses,  no organomegaly Extremities: edema 1+ pitting edema bilaterally, chronic venous stasis changes noted bilaterally.    Pulses: 2+ and symmetric  Assessment/Plan:  Intermittent Chest Pain  - Patient states that the medications given here have resolved her chest pain for now .Serial troponin were negative and EKGs were negative for evidence of acute coronary syndrome.  Awaiting results of the Echo, since patient could possibly have right sided heart failure due to possible pulmonary hypertension. Nitro PRN for chest pain   Bilateral Lower Extremity Edema  -Patient states that the edema in her legs has decreased since the administration of Lasix. Awaiting results of echo to determine if the edema is due to Venous insufficiency vs right sided heart failure. If the echo is unremarkable will consider compression stocking therapy.    Interstitial Lung Disease  -Chest CXR and CT revealed evidence of possible interstitial lung disease, which is consistent with ronchi heard on exam. The patient is currently denies any dyspnea. Patient reports past occupational history of working at [employer] which is consistent with the above diagnosis. Could possibly be the cause of the patients pulmonary  Hypertension. Will monitor vitals closely.    Normocytic Anemia  Patient presented with a hgb of 10.4 and an MCV of 93.5. Iron panel was unremarkable. Seeing that her ESR was elevated,  could possibly be due to inflammatory arthritis especially given the context of her increased joint pain and swelling accompanying the onset of her chest pain. Will enquire when family is present if there was a prior rheumatological work-up, if not will order ANA, anti-ccp, RF factor.   Disposition: Awaiting results of echo, prior to determining discharge time frame. Anticipating estimated discharge in 24 to 48 hours.          This is a Careers information officer Note.  The care of the patient was discussed with Dr.Granfortuna and the assessment and plan formulated with their assistance.  Please see their attached note for official documentation of the  daily encounter.   LOS: 0 days   Traci Meyer, Traci Meyer, Medical Student 08/02/2017, 3:06 PM

## 2017-08-03 ENCOUNTER — Encounter (HOSPITAL_COMMUNITY): Payer: Self-pay | Admitting: General Practice

## 2017-08-03 DIAGNOSIS — J849 Interstitial pulmonary disease, unspecified: Secondary | ICD-10-CM

## 2017-08-03 DIAGNOSIS — Z79899 Other long term (current) drug therapy: Secondary | ICD-10-CM

## 2017-08-03 DIAGNOSIS — I110 Hypertensive heart disease with heart failure: Principal | ICD-10-CM

## 2017-08-03 DIAGNOSIS — E039 Hypothyroidism, unspecified: Secondary | ICD-10-CM

## 2017-08-03 DIAGNOSIS — I872 Venous insufficiency (chronic) (peripheral): Secondary | ICD-10-CM

## 2017-08-03 DIAGNOSIS — D649 Anemia, unspecified: Secondary | ICD-10-CM

## 2017-08-03 DIAGNOSIS — I5030 Unspecified diastolic (congestive) heart failure: Secondary | ICD-10-CM

## 2017-08-03 DIAGNOSIS — E785 Hyperlipidemia, unspecified: Secondary | ICD-10-CM

## 2017-08-03 LAB — MULTIPLE MYELOMA PANEL, SERUM
ALBUMIN SERPL ELPH-MCNC: 3.2 g/dL (ref 2.9–4.4)
ALPHA2 GLOB SERPL ELPH-MCNC: 0.8 g/dL (ref 0.4–1.0)
Albumin/Glob SerPl: 1.2 (ref 0.7–1.7)
Alpha 1: 0.2 g/dL (ref 0.0–0.4)
B-GLOBULIN SERPL ELPH-MCNC: 1 g/dL (ref 0.7–1.3)
GAMMA GLOB SERPL ELPH-MCNC: 0.7 g/dL (ref 0.4–1.8)
GLOBULIN, TOTAL: 2.7 g/dL (ref 2.2–3.9)
IGG (IMMUNOGLOBIN G), SERUM: 789 mg/dL (ref 700–1600)
IgA: 226 mg/dL (ref 64–422)
IgM, Serum: 68 mg/dL (ref 26–217)
TOTAL PROTEIN ELP: 5.9 g/dL — AB (ref 6.0–8.5)

## 2017-08-03 MED ORDER — FUROSEMIDE 20 MG PO TABS
20.0000 mg | ORAL_TABLET | Freq: Every day | ORAL | Status: DC
  Administered 2017-08-03: 20 mg via ORAL
  Filled 2017-08-03: qty 1

## 2017-08-03 MED ORDER — FUROSEMIDE 20 MG PO TABS: 20.0000 mg | ORAL_TABLET | Freq: Every day | ORAL | 0 refills | Status: DC

## 2017-08-03 NOTE — Care Management Note (Signed)
Case Management Note  Patient Details  Name: Traci Meyer MRN: [id number] Date of Birth: 07-02-1931  Subjective/Objective:                 Patient with order to DC to home today. Chart reviewed. No Home Health or Equipment needs, no unacknowledged Case Management consults or medication needs identified at the time of this note. Plan for DC to home. If needs arise today prior to discharge, please call Traci SabalDebbie Jamel Holzmann RN CM at [phone].    Action/Plan:   Expected Discharge Date:  08/03/17               Expected Discharge Plan:  Home/Self Care  In-House Referral:     Discharge planning Services  CM Consult  Post Acute Care Choice:    Choice offered to:     DME Arranged:    DME Agency:     HH Arranged:    HH Agency:     Status of Service:  Completed, signed off  If discussed at [employer] Length of Stay Meetings, dates discussed:    Additional Comments:  Traci SabalDebbie Keshana Klemz, RN 08/03/2017, 10:27 AM

## 2017-08-03 NOTE — Discharge Summary (Signed)
Name: Traci Meyer MRN: [id number] DOB: [birth year] 83 y.o. PCP: System, Pcp Not In  Date of Admission: 08/01/2017  2:11 PM Date of Discharge: 08/03/2017 Attending Physician: Annia Belt, MD  Discharge Diagnosis: 1. Heart Failure with preserved Ejection Fraction  2.Intersitial Lung Disease  3. Normocytic Anemia  4. Lower Extremity Edema due to Chronic Venus Stasis 5.Hypothyroidism  6.Hypertension  7.Hyperlipidemia   Discharge Medications: Allergies as of 08/03/2017      Reactions   Penicillins    Rash       Medication List    TAKE these medications   aspirin 81 MG chewable tablet Chew by mouth daily.   enalapril 20 MG tablet Commonly known as:  VASOTEC Take 20 mg by mouth 2 (two) times daily.   furosemide 20 MG tablet Commonly known as:  LASIX Take 1 tablet (20 mg total) by mouth daily.   hydrochlorothiazide 12.5 MG capsule Commonly known as:  MICROZIDE Take 12.5 mg by mouth daily.   levothyroxine 75 MCG tablet Commonly known as:  SYNTHROID, LEVOTHROID Take 75 mcg by mouth daily before breakfast.   meloxicam 15 MG tablet Commonly known as:  MOBIC Take 15 mg by mouth daily.   metoprolol succinate 25 MG 24 hr tablet Commonly known as:  TOPROL-XL Take 25 mg by mouth daily.   oxybutynin 5 MG tablet Commonly known as:  DITROPAN Take 5 mg by mouth daily.   rosuvastatin 10 MG tablet Commonly known as:  CRESTOR Take 10 mg by mouth daily.       Disposition and follow-up:   Traci Meyer was discharged from [facility] in Good condition.  At the hospital follow up visit please address:  1.  Management of her HFpEF, and Interstitial Lung Disease  2.  Labs / imaging needed at time of follow-up: Basic Metabolic panel and blood pressure check  3.  Pending labs/ test needing follow-up:Multiple Myeloma Pannel  Follow-up Appointments:   Hospital Course by problem list: 1. Heart Failure with preserved Ejection Fraction    Traci Meyer presented to [facility] on 8/7 with symptoms of chest pain and bilateral leg swelling. Serial EKGs and troponins were negative ruling out acute coronary syndrome. CTA was negative for PE.  Patients chest pain resolved with 76m IV lasix. Echo showed grade 2 diastolic dysfunction, and EF of 60-65% and elevated pulmonary artery pressures, thus diagnosing the patient with heart failure with preserved ejection fraction. Prior to admission, the patient denied any dyspnea on extertion, pillow orthopneas or PND. On discharge she was given a prescription for 210mof Lasix to take daily  To help improve her leg swelling.   2) Interstitial Lung Disease   CTA upon admission showed ground glass opacties in her lungs consistent with intersitial lung disease. Patient currenlty denies any dyspnea but has bilaterally ronchi on exam. 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 An Iron panel was unremarkable and her ESR was 43. Given these findings and her age multiple myeloma is a possibility. Multiple Myeloma panel currently pending.    4) Bilateral Lower Leg Edema due to Chronic Venous Stasis  -Skin findings on the patients legs were consistent with chronic venous stasis. Some of her edema may be due to venous insufficiency therefore we inititated compression stocking therapy.  5)Hypothyroidsm  Continue home medications   6) Hypertension  Continue enalapril and metoprolol. Hydrochlorothiazide was held due to the initiation of Lasix. If BP is uncontrolled at follow-up visit, then adjust anti-hypertensive therapy.   6)Hyperlipidemia  Continue home rosuvastatin.   Discharge Vitals:   BP (!) 157/86 (BP Location: Right Arm)   Pulse 64   Temp 98.8 F (37.1 C) (Oral)   Resp 18   Ht 5' 3"  (1.6 m)   Wt 89.2 kg  (196 lb 11.2 oz)   SpO2 96%   BMI 34.84 kg/m   Pertinent Labs, Studies, and Procedures:  Echo  Left ventricle: The cavity size was normal. Systolic function was   normal. The estimated ejection fraction was in the range of 60%   to 65%. Wall motion was normal; there were no regional wall   motion abnormalities. Features are consistent with a pseudonormal   left ventricular filling pattern, with concomitant abnormal   relaxation and increased filling pressure (grade 2 diastolic   dysfunction). - Aortic valve: There was trivial regurgitation. - Pulmonary arteries: PA peak pressure: 41 mm Hg (S).   CTA  IMPRESSION: 1. Limited pulmonary arterial opacification beyond the proximal segmental level. No large central pulmonary embolus is identified. 2. Dilatation of the main pulmonary artery to 4.2 cm consistent with a component of pulmonary hypertension. 3. Cardiomegaly with coronary arteriosclerosis is noted. 4. Minimal aortic atherosclerosis without aneurysm or dissection. 5. Thoracolumbar spondylosis and bilateral glenohumeral joint osteoarthritis. 6. Faint nonspecific ground-glass opacities bilaterally which may reflect hypoventilatory change or possibly alveolitis/pneumonitis among some possibilities though exclusive. Discharge Instructions: Discharge Instructions    Call MD for:  difficulty breathing, headache or visual disturbances    Complete by:  As directed    Call MD for:  extreme fatigue    Complete by:  As directed    Call MD for:  persistant dizziness or light-headedness    Complete by:  As directed    Diet - low sodium heart healthy    Complete by:  As directed    Discharge instructions    Complete by:  As directed    Traci Meyer,  It was a pleasure taking care of you in the hospital.  Please follow the following discharge instructions: - You have a new prescription for a fluid pill called Lasix to help with the swelling in your legs.  You will take 28m daily.   -  With starting Lasix, please stop taking your hydrochlorithiazide diuretic.  We will monitor your blood pressure at hospital follow up and make adjustments as needed - Please use compression stockings as well to help with leg swelling and elevate your legs when possible. - Please attend your hospital follow up in our clinic on August 14 at 2:45pm.  They will help make sure you are still doing okay after your hospital stay and [facility].   Increase activity slowly    Complete by:  As directed       Signed: ADwaine Deter Medical Student 08/03/2017, 10:04 AM   Pager: @[phone] @

## 2017-08-03 NOTE — Progress Notes (Signed)
Reviewed DC instructions with pt and daughter at bedside. Transport will be arranged once daughter can get a ride back to her home.

## 2017-08-03 NOTE — Progress Notes (Signed)
Patient's family at the bedside. We have discussed patient's disposition at length. Patient and family is understanding that the patient will be discharged home. [facility] is here for pick up.

## 2017-08-03 NOTE — Progress Notes (Addendum)
  Subjective: Patient was seen and examined today, with daughter and grandchildren present at bedside. She had no acute events overnight. She reports that she is no longer having chest pain and the pain in her legs is slightly better.   Objective:  Intake/Output Summary (Last 24 hours) at 08/03/17 0941 Last data filed at 08/03/17 0700  Gross per 24 hour  Intake              120 ml  Output             1050 ml  Net             -930 ml   BP (!) 157/86 (BP Location: Right Arm)   Pulse 64   Temp 98.8 F (37.1 C) (Oral)   Resp 18   Ht 5\' 3"  (1.6 m)   Wt 89.2 kg (196 lb 11.2 oz)   SpO2 96%   BMI 34.84 kg/m  General appearance: alert, cooperative, appears stated age and no distress Head: Normocephalic, without obvious abnormality, atraumatic Lungs: rhonchi base - Present bilaterally Heart: regular rate and rhythm, S1, S2 normal, no murmur, click, rub or gallop Extremities: edema 1+ present bilaterally extending to the thighs and venous stasis dermatitis noted Skin: Xerosis present across back and legs   Assessment/Plan:  Chest Pain  Patient states that her chest pain has resolved since admission. ACS workup was negative. Cardiac Echo revealed Grade 2 diastolic dysfunction and moderate pulmonary hypertension with PA peak pressure 41mmHg. Changes are most likely due to patient's long standing hypertension.    Heart Failure with Preserved Ejection Fraction  Echocardiogram revealed Grade 2 diastolic dysfunction with an ejection fraction of 60-65%. This is likely the major contributing factor of our patients lower extremity edema.  - Starting 20mg  Lasix daily.  - Will recheck BMP in a week at follow-up appointment - Will hold HCTZ in the interim  Hypertension Home medications are enalapril, HCTZ, Toprol-XL.  BP has been 120-150 systolic. - Continue home meds except hold HCTZ as above at discharge due to starting Lasix - Reassess BP at hospital follow up   Bilateral Lower Extremity  Edema   Given that the patient had chronic venous stasis changes present on her legs. It is reasonable to assume that the patient has venus insufficieny as well.  - Ordered compression stocking therapy.   - Recommend elevating legs  Disposition: Discharge Today to home  This is a Psychologist, occupationalMedical Student Note.  The care of the patient was discussed with Dr.Kynadi Dragos and the assessment and plan formulated with their assistance.  Please see their attached note for official documentation of the daily encounter.   LOS: 1 day   Avva, Junius FinnerKalyani L, Medical Student 08/03/2017, 9:41 AM   Attestation for Student Documentation:  I personally was present and performed or re-performed the history, physical exam and medical decision-making activities of this service and have verified that the service and findings are accurately documented in the student's note.  Gwynn BurlyWallace, Khair Chasteen, DO 08/03/2017, 10:35 AM

## 2017-08-10 ENCOUNTER — Ambulatory Visit: Payer: Medicaid Other

## 2017-08-13 ENCOUNTER — Ambulatory Visit: Payer: Medicaid Other

## 2017-08-25 ENCOUNTER — Encounter (HOSPITAL_COMMUNITY): Payer: Self-pay | Admitting: Emergency Medicine

## 2017-08-25 ENCOUNTER — Emergency Department (HOSPITAL_COMMUNITY): Payer: Medicare Other

## 2017-08-25 ENCOUNTER — Inpatient Hospital Stay (HOSPITAL_COMMUNITY)
Admission: EM | Admit: 2017-08-25 | Discharge: 2017-08-29 | DRG: 293 | Disposition: A | Discharge status: Skilled Nursing Facility | Payer: Medicare Other | Attending: Student in an Organized Health Care Education/Training Program | Admitting: Student in an Organized Health Care Education/Training Program

## 2017-08-25 DIAGNOSIS — I10 Essential (primary) hypertension: Secondary | ICD-10-CM | POA: Diagnosis present

## 2017-08-25 DIAGNOSIS — Z880 Allergy status to penicillin: Secondary | ICD-10-CM

## 2017-08-25 DIAGNOSIS — E876 Hypokalemia: Secondary | ICD-10-CM

## 2017-08-25 DIAGNOSIS — I440 Atrioventricular block, first degree: Secondary | ICD-10-CM | POA: Diagnosis present

## 2017-08-25 DIAGNOSIS — I110 Hypertensive heart disease with heart failure: Principal | ICD-10-CM | POA: Diagnosis present

## 2017-08-25 DIAGNOSIS — M21331 Wrist drop, right wrist: Secondary | ICD-10-CM | POA: Diagnosis present

## 2017-08-25 DIAGNOSIS — M160 Bilateral primary osteoarthritis of hip: Secondary | ICD-10-CM

## 2017-08-25 DIAGNOSIS — E785 Hyperlipidemia, unspecified: Secondary | ICD-10-CM | POA: Diagnosis not present

## 2017-08-25 DIAGNOSIS — Z993 Dependence on wheelchair: Secondary | ICD-10-CM

## 2017-08-25 DIAGNOSIS — I872 Venous insufficiency (chronic) (peripheral): Secondary | ICD-10-CM | POA: Diagnosis not present

## 2017-08-25 DIAGNOSIS — R601 Generalized edema: Secondary | ICD-10-CM | POA: Diagnosis present

## 2017-08-25 DIAGNOSIS — I700 Atherosclerosis of aorta: Secondary | ICD-10-CM | POA: Diagnosis present

## 2017-08-25 DIAGNOSIS — L819 Disorder of pigmentation, unspecified: Secondary | ICD-10-CM

## 2017-08-25 DIAGNOSIS — R609 Edema, unspecified: Secondary | ICD-10-CM

## 2017-08-25 DIAGNOSIS — R001 Bradycardia, unspecified: Secondary | ICD-10-CM | POA: Diagnosis not present

## 2017-08-25 DIAGNOSIS — Z79899 Other long term (current) drug therapy: Secondary | ICD-10-CM

## 2017-08-25 DIAGNOSIS — M170 Bilateral primary osteoarthritis of knee: Secondary | ICD-10-CM | POA: Diagnosis not present

## 2017-08-25 DIAGNOSIS — D649 Anemia, unspecified: Secondary | ICD-10-CM

## 2017-08-25 DIAGNOSIS — R600 Localized edema: Secondary | ICD-10-CM

## 2017-08-25 DIAGNOSIS — Z7982 Long term (current) use of aspirin: Secondary | ICD-10-CM

## 2017-08-25 DIAGNOSIS — R531 Weakness: Secondary | ICD-10-CM

## 2017-08-25 DIAGNOSIS — Z791 Long term (current) use of non-steroidal anti-inflammatories (NSAID): Secondary | ICD-10-CM

## 2017-08-25 DIAGNOSIS — E039 Hypothyroidism, unspecified: Secondary | ICD-10-CM | POA: Diagnosis present

## 2017-08-25 DIAGNOSIS — I5032 Chronic diastolic (congestive) heart failure: Secondary | ICD-10-CM

## 2017-08-25 DIAGNOSIS — Z87891 Personal history of nicotine dependence: Secondary | ICD-10-CM

## 2017-08-25 DIAGNOSIS — I5033 Acute on chronic diastolic (congestive) heart failure: Secondary | ICD-10-CM | POA: Diagnosis not present

## 2017-08-25 DIAGNOSIS — Z79890 Hormone replacement therapy (postmenopausal): Secondary | ICD-10-CM

## 2017-08-25 LAB — COMPREHENSIVE METABOLIC PANEL
ALBUMIN: 3.2 g/dL — AB (ref 3.5–5.0)
ALT: 19 U/L (ref 14–54)
AST: 49 U/L — AB (ref 15–41)
Alkaline Phosphatase: 71 U/L (ref 38–126)
Anion gap: 7 (ref 5–15)
BUN: 17 mg/dL (ref 6–20)
CALCIUM: 9.1 mg/dL (ref 8.9–10.3)
CHLORIDE: 106 mmol/L (ref 101–111)
CO2: 26 mmol/L (ref 22–32)
Creatinine, Ser: 0.62 mg/dL (ref 0.44–1.00)
GFR calc Af Amer: >60 mL/min (ref >60)
GLUCOSE: 102 mg/dL — AB (ref 65–99)
POTASSIUM: 3 mmol/L — AB (ref 3.5–5.1)
Sodium: 139 mmol/L (ref 135–145)
TOTAL PROTEIN: 6.5 g/dL (ref 6.5–8.1)
Total Bilirubin: 0.5 mg/dL (ref 0.3–1.2)

## 2017-08-25 LAB — CBC WITH DIFFERENTIAL/PLATELET
BASOS ABS: 0 10*3/uL (ref 0.0–0.1)
BASOS PCT: 1 %
EOS PCT: 3 %
Eosinophils Absolute: 0.2 10*3/uL (ref 0.0–0.7)
HEMATOCRIT: 27 % — AB (ref 36.0–46.0)
Hemoglobin: 8.8 g/dL — ABNORMAL LOW (ref 12.0–15.0)
LYMPHS PCT: 12 %
Lymphs Abs: 1 10*3/uL (ref 0.7–4.0)
MCH: 30.2 pg (ref 26.0–34.0)
MCHC: 32.6 g/dL (ref 30.0–36.0)
MCV: 92.8 fL (ref 78.0–100.0)
Monocytes Absolute: 0.6 10*3/uL (ref 0.1–1.0)
Monocytes Relative: 6 %
NEUTROS ABS: 6.9 10*3/uL (ref 1.7–7.7)
Neutrophils Relative %: 78 %
PLATELETS: 364 10*3/uL (ref 150–400)
RBC: 2.91 MIL/uL — AB (ref 3.87–5.11)
RDW: 16.3 % — ABNORMAL HIGH (ref 11.5–15.5)
WBC: 8.8 10*3/uL (ref 4.0–10.5)

## 2017-08-25 LAB — URINALYSIS, ROUTINE W REFLEX MICROSCOPIC
Bilirubin Urine: NEGATIVE
Glucose, UA: NEGATIVE mg/dL
Hgb urine dipstick: NEGATIVE
KETONES UR: NEGATIVE mg/dL
Nitrite: NEGATIVE
PROTEIN: NEGATIVE mg/dL
Specific Gravity, Urine: 1.026 (ref 1.005–1.030)
pH: 5 (ref 5.0–8.0)

## 2017-08-25 LAB — I-STAT TROPONIN, ED: TROPONIN I, POC: 0.02 ng/mL (ref 0.00–0.08)

## 2017-08-25 LAB — BRAIN NATRIURETIC PEPTIDE: B Natriuretic Peptide: 34.9 pg/mL (ref 0.0–100.0)

## 2017-08-25 LAB — POC OCCULT BLOOD, ED: FECAL OCCULT BLD: NEGATIVE

## 2017-08-25 LAB — MAGNESIUM: MAGNESIUM: 1.8 mg/dL (ref 1.7–2.4)

## 2017-08-25 LAB — I-STAT CG4 LACTIC ACID, ED: LACTIC ACID, VENOUS: 0.99 mmol/L (ref 0.5–1.9)

## 2017-08-25 MED ORDER — ENOXAPARIN SODIUM 40 MG/0.4ML ~~LOC~~ SOLN
40.0000 mg | SUBCUTANEOUS | Status: DC
  Administered 2017-08-25 – 2017-08-29 (×5): 40 mg via SUBCUTANEOUS
  Filled 2017-08-25 (×5): qty 0.4

## 2017-08-25 MED ORDER — SODIUM CHLORIDE 0.9% FLUSH
3.0000 mL | Freq: Two times a day (BID) | INTRAVENOUS | Status: DC
  Administered 2017-08-25 – 2017-08-28 (×8): 3 mL via INTRAVENOUS

## 2017-08-25 MED ORDER — POTASSIUM CHLORIDE CRYS ER 20 MEQ PO TBCR
40.0000 meq | EXTENDED_RELEASE_TABLET | Freq: Once | ORAL | Status: AC
  Administered 2017-08-25: 40 meq via ORAL
  Filled 2017-08-25: qty 2

## 2017-08-25 MED ORDER — METOPROLOL SUCCINATE ER 25 MG PO TB24
25.0000 mg | ORAL_TABLET | Freq: Every day | ORAL | Status: DC
  Administered 2017-08-25 – 2017-08-29 (×5): 25 mg via ORAL
  Filled 2017-08-25 (×5): qty 1

## 2017-08-25 MED ORDER — OXYBUTYNIN CHLORIDE 5 MG PO TABS
5.0000 mg | ORAL_TABLET | Freq: Every day | ORAL | Status: DC
  Administered 2017-08-25 – 2017-08-29 (×5): 5 mg via ORAL
  Filled 2017-08-25 (×5): qty 1

## 2017-08-25 MED ORDER — SODIUM CHLORIDE 0.9% FLUSH: 3.0000 mL | INTRAVENOUS | Status: DC | PRN

## 2017-08-25 MED ORDER — ROSUVASTATIN CALCIUM 10 MG PO TABS
10.0000 mg | ORAL_TABLET | Freq: Every day | ORAL | Status: DC
  Administered 2017-08-25 – 2017-08-29 (×5): 10 mg via ORAL
  Filled 2017-08-25 (×5): qty 1

## 2017-08-25 MED ORDER — FUROSEMIDE 20 MG PO TABS
20.0000 mg | ORAL_TABLET | Freq: Every day | ORAL | Status: DC
  Administered 2017-08-25 – 2017-08-26 (×2): 20 mg via ORAL
  Filled 2017-08-25 (×2): qty 1

## 2017-08-25 MED ORDER — LEVOTHYROXINE SODIUM 75 MCG PO TABS
75.0000 ug | ORAL_TABLET | Freq: Every day | ORAL | Status: DC
  Administered 2017-08-26 – 2017-08-29 (×4): 75 ug via ORAL
  Filled 2017-08-25 (×4): qty 1

## 2017-08-25 MED ORDER — ENALAPRIL MALEATE 20 MG PO TABS
20.0000 mg | ORAL_TABLET | Freq: Two times a day (BID) | ORAL | Status: DC
  Administered 2017-08-25 – 2017-08-29 (×8): 20 mg via ORAL
  Filled 2017-08-25: qty 2
  Filled 2017-08-25 (×9): qty 1

## 2017-08-25 MED ORDER — HYDROCHLOROTHIAZIDE 12.5 MG PO CAPS: 12.5000 mg | ORAL_CAPSULE | Freq: Every day | ORAL | Status: DC

## 2017-08-25 MED ORDER — FUROSEMIDE 10 MG/ML IJ SOLN
40.0000 mg | Freq: Once | INTRAMUSCULAR | Status: AC
  Administered 2017-08-25: 40 mg via INTRAVENOUS
  Filled 2017-08-25: qty 4

## 2017-08-25 MED ORDER — SODIUM CHLORIDE 0.9 % IV SOLN: 250.0000 mL | INTRAVENOUS | Status: DC | PRN

## 2017-08-25 MED ORDER — ASPIRIN 81 MG PO CHEW
81.0000 mg | CHEWABLE_TABLET | Freq: Every day | ORAL | Status: DC
  Administered 2017-08-25 – 2017-08-29 (×5): 81 mg via ORAL
  Filled 2017-08-25 (×5): qty 1

## 2017-08-25 MED ORDER — POTASSIUM CHLORIDE 20 MEQ/15ML (10%) PO SOLN
40.0000 meq | Freq: Once | ORAL | Status: AC
  Administered 2017-08-25: 40 meq via ORAL
  Filled 2017-08-25: qty 30

## 2017-08-25 NOTE — ED Notes (Addendum)
Pt is unable to spread her legs apart, so external catheter applied. EDP made aware.

## 2017-08-25 NOTE — Progress Notes (Addendum)
BP 128/65 (BP Location: Left Arm)   Pulse 94   Temp 98.5 F (36.9 C) (Oral)   Resp 18   Ht 5\' 2"  (1.575 m)   Wt 96.2 kg (212 lb)   SpO2 100%   BMI 38.78 kg/m   Patient recently got discharged from the internal medicine teaching service (on 08/03/2017) for chronic diastolic heart failure and anasarca, I did spoke with the internal medicine resident on call who agreed to take the patient. We'll transfer the patient to [facility] and she will be seen by the internal medicine teaching service today.

## 2017-08-25 NOTE — ED Triage Notes (Signed)
Pt BIB EMS from home with complaints of leg swelling. Patient was started on Lasix 8/14 and states she has been compliant but swelling is not getting any better.

## 2017-08-25 NOTE — Progress Notes (Signed)
Patient stable from ED report.  Patient oriented to room and safely transferred to bed.  Patient and family made aware from Dr David Stall that patient would be transported to [facility] via [facility] for teaching service.  Patient and family verbalized understanding.  [facility] called in order to set up transport.

## 2017-08-25 NOTE — ED Notes (Signed)
Pt unable to ambulate. Per patient, legs hurt her too bad for them to be moved or touched.

## 2017-08-25 NOTE — Progress Notes (Signed)
Traci Meyer is a 83 y.o. female patient admitted from [facility] awake, alert - oriented  X 4 - no acute distress noted.  VSS - Blood pressure 128/65, pulse 94, temperature 98.5 F (36.9 C), temperature source Oral, resp. rate 18, height 5\' 2"  (1.575 m), weight 96.2 kg (212 lb), SpO2 100 %.    IV in place, occlusive dsg intact without redness.  Orientation to room, and floor completed with information packet given to patient/family.  Patient declined safety video at this time.  Admission INP armband ID verified with patient/family, and in place.   SR up x 2, fall assessment complete, with patient and family able to verbalize understanding of risk associated with falls, and verbalized understanding to call nsg before up out of bed.  Call light within reach, patient able to voice, and demonstrate understanding.  Skin, clean-dry- intact without evidence of bruising, or skin tears.   No evidence of skin break down noted on exam.     Will cont to eval and treat per MD orders.  Eligah East, RN 08/25/2017 10:38 AM

## 2017-08-25 NOTE — ED Notes (Signed)
Patient transported to X-ray 

## 2017-08-25 NOTE — ED Provider Notes (Signed)
Medical screening examination/treatment/procedure(s) were conducted as a shared visit with non-physician practitioner(s) and myself.  I personally evaluated the patient during the encounter.   EKG Interpretation  Date/Time:  Wednesday August 25 2017 02:38:37 EDT Ventricular Rate:  91 PR Interval:    QRS Duration: 93 QT Interval:  364 QTC Calculation: 448 R Axis:   -24 Text Interpretation:  Sinus rhythm Prolonged PR interval Inferior infarct, old Consider anterior infarct No significant change since last tracing Confirmed by Rochele Raring [phone]) on 08/25/2017 2:47:49 AM      Pt is a 83 y.o. female with history of hypertension, hypothyroidism, hyperlipidemia who presented to the emergency department increasing swelling of her lower extremities and difficulty walking secondary to pain in her legs. Admitted to the hospital recently for similar symptoms. She denies any chest pain or shortness of breath. She had a CT of her chest at that time which showed no pulmonary embolus, echocardiogram which showed an EF of 60-65% with grade 2 diastolic dysfunction.  She denies any fevers, cough, vomiting or diarrhea. Here labs are unremarkable other than a slight drop in her hemoglobin she is Hemoccult negative and denies any vaginal bleeding.  Urine does not show any obvious infection. Unfortunately secondary to the peripheral edema in her lower extremity she is unable to ambulate secondary to pain. Hospitalist has been consulted and they plan to admit patient.   Ward, Layla Maw, DO 08/25/17 [phone]

## 2017-08-25 NOTE — Progress Notes (Signed)
Attempted to call report to nurse that will receive patient on 5W.  Informed that she would call me back.  [facility] here to transport patient.

## 2017-08-25 NOTE — ED Notes (Signed)
Bed: [id number] Expected date:  Expected time:  Means of arrival:  Comments: 83 yr old leg swelling

## 2017-08-25 NOTE — ED Provider Notes (Signed)
[facility]-EMERGENCY DEPT Provider Note   CSN: [id number] Arrival date & time: 08/25/17  0134     History   Chief Complaint Chief Complaint  Patient presents with  . Leg Swelling    HPI Traci Meyer is a 83 y.o. female with a hx of Arthritis, hypertension, aortic atherosclerosis, bilateral lower leg edema presents to the Emergency Department complaining of gradual, persistent, progressively worsening swelling and pain of her bilateral lower legs. Patient reports that she's had increasing weakness over the last week and tonight she was unable to get out of her wheelchair and get into bed because her legs hurt so badly. She reports difficulty moving around at home due to the pain and swelling. Review shows the patient was admitted earlier this month for similar symptoms and was placed on Lasix. Patient reports she's been taking these medications as directed.  Denies fevers or chills, nausea or vomiting, chest pain or shortness of breath.  Nothing seems to make her symptoms better or worse.  The history is provided by the patient and medical records. No language interpreter was used.    Past Medical History:  Diagnosis Date  . Aortic atherosclerosis (HCC)   . Arthritis   . Hypertension     Patient Active Problem List   Diagnosis Date Noted  . Anasarca 08/25/2017  . Chest pain 08/01/2017  . Bilateral leg edema 08/01/2017  . Arthritis 08/01/2017  . Hypertension 08/01/2017  . Aortic atherosclerosis (HCC) 08/01/2017    Past Surgical History:  Procedure Laterality Date  . MULTIPLE TOOTH EXTRACTIONS      OB History    No data available       Home Medications    Prior to Admission medications   Medication Sig Start Date End Date Taking? Authorizing Provider  aspirin 81 MG chewable tablet Chew by mouth daily.   Yes [provider]  enalapril (VASOTEC) 20 MG tablet Take 20 mg by mouth 2 (two) times daily.   Yes [provider]  furosemide (LASIX) 20 MG  tablet Take 1 tablet (20 mg total) by mouth daily. 08/03/17  Yes Gwynn BurlyWallace, Andrew, DO  hydrochlorothiazide (MICROZIDE) 12.5 MG capsule Take 12.5 mg by mouth daily.   Yes [provider]  levothyroxine (SYNTHROID, LEVOTHROID) 75 MCG tablet Take 75 mcg by mouth daily before breakfast.   Yes [provider]  meloxicam (MOBIC) 15 MG tablet Take 15 mg by mouth daily.   Yes [provider]  metoprolol succinate (TOPROL-XL) 25 MG 24 hr tablet Take 25 mg by mouth daily.   Yes [provider]  oxybutynin (DITROPAN) 5 MG tablet Take 5 mg by mouth daily.   Yes [provider]  rosuvastatin (CRESTOR) 10 MG tablet Take 10 mg by mouth daily.   Yes [provider]    Family History Family History  Problem Relation Age of Onset  . Heart disease Neg Hx     Social History Social History  Substance Use Topics  . Smoking status: Former Games developermoker  . Smokeless tobacco: Never Used     Comment: 45 years ago briefly  . Alcohol use No     Allergies   Penicillins   Review of Systems Review of Systems  Constitutional: Negative for appetite change, diaphoresis, fatigue, fever and unexpected weight change.  HENT: Negative for mouth sores.   Eyes: Negative for visual disturbance.  Respiratory: Negative for cough, chest tightness, shortness of breath and wheezing.   Cardiovascular: Positive for leg swelling. Negative for chest  pain.  Gastrointestinal: Negative for abdominal pain, constipation, diarrhea, nausea and vomiting.  Endocrine: Negative for polydipsia, polyphagia and polyuria.  Genitourinary: Negative for dysuria, frequency, hematuria and urgency.  Musculoskeletal: Negative for back pain and neck stiffness.  Skin: Negative for rash.  Allergic/Immunologic: Negative for immunocompromised state.  Neurological: Positive for weakness ( Generalized). Negative for syncope, light-headedness and headaches.  Hematological: Does not bruise/bleed easily.    Psychiatric/Behavioral: Negative for sleep disturbance. The patient is not nervous/anxious.      Physical Exam Updated Vital Signs BP 116/62 (BP Location: Left Arm)   Pulse (!) 101   Temp 98.8 F (37.1 C) (Oral)   Resp (!) 23   SpO2 95%   Physical Exam  Constitutional: She appears well-developed and well-nourished. No distress.  Awake, alert, nontoxic appearance  HENT:  Head: Normocephalic and atraumatic.  Mouth/Throat: Oropharynx is clear and moist. No oropharyngeal exudate.  Eyes: Conjunctivae are normal. No scleral icterus.  Neck: Normal range of motion. Neck supple.  Cardiovascular: Normal rate, regular rhythm and intact distal pulses.   Pulses:      Radial pulses are 2+ on the right side, and 2+ on the left side.       Dorsalis pedis pulses are 1+ on the right side, and 1+ on the left side.  Capillary refill less than 3 seconds  Pulmonary/Chest: Effort normal and breath sounds normal. No respiratory distress. She has no wheezes.  Equal chest expansion  Abdominal: Soft. Bowel sounds are normal. She exhibits no mass. There is no tenderness. There is no rebound and no guarding.  Obese  Musculoskeletal: Normal range of motion. She exhibits edema ( 4+ pitting edema extending from the distal forefoot to her proximal thighs).  Tenderness to palpation throughout the bilateral lower extremities  Neurological: She is alert.  Speech is clear and goal oriented Moves extremities without ataxia  Skin: Skin is warm and dry. She is not diaphoretic.  Psychiatric: She has a normal mood and affect.  Nursing note and vitals reviewed.    ED Treatments / Results  Labs (all labs ordered are listed, but only abnormal results are displayed) Labs Reviewed  CBC WITH DIFFERENTIAL/PLATELET - Abnormal; Notable for the following:       Result Value   RBC 2.91 (*)    Hemoglobin 8.8 (*)    HCT 27.0 (*)    RDW 16.3 (*)    All other components within normal limits  COMPREHENSIVE METABOLIC  PANEL - Abnormal; Notable for the following:    Potassium 3.0 (*)    Glucose, Bld 102 (*)    Albumin 3.2 (*)    AST 49 (*)    All other components within normal limits  URINALYSIS, ROUTINE W REFLEX MICROSCOPIC - Abnormal; Notable for the following:    APPearance HAZY (*)    Leukocytes, UA TRACE (*)    Bacteria, UA RARE (*)    Squamous Epithelial / LPF 6-30 (*)    All other components within normal limits  BRAIN NATRIURETIC PEPTIDE  I-STAT TROPONIN, ED  I-STAT CG4 LACTIC ACID, ED  POC OCCULT BLOOD, ED    EKG  EKG Interpretation  Date/Time:  Wednesday August 25 2017 02:38:37 EDT Ventricular Rate:  91 PR Interval:    QRS Duration: 93 QT Interval:  364 QTC Calculation: 448 R Axis:   -24 Text Interpretation:  Sinus rhythm Prolonged PR interval Inferior infarct, old Consider anterior infarct No significant change since last tracing Confirmed by Rochele Raring [phone]) on 08/25/2017 2:47:49  AM       Radiology Dg Chest 2 View  Result Date: 08/25/2017 CLINICAL DATA:  Lower extremity swelling EXAM: CHEST  2 VIEW COMPARISON:  08/01/2017 FINDINGS: Shallow inspiration. Unchanged cardiomegaly. No airspace consolidation. No effusions. Normal pulmonary vasculature. IMPRESSION: Stable cardiomegaly.  No consolidation or effusion. Electronically Signed   By: Ellery Plunk M.D.   On: 08/25/2017 03:17    Procedures Procedures (including critical care time)  Medications Ordered in ED Medications  potassium chloride SA (K-DUR,KLOR-CON) CR tablet 40 mEq (40 mEq Oral Given 08/25/17 1610)     Initial Impression / Assessment and Plan / ED Course  I have reviewed the triage vital signs and the nursing notes.  Pertinent labs & imaging results that were available during my care of the patient were reviewed by me and considered in my medical decision making (see chart for details).  Clinical Course as of Aug 25 713  Wed Aug 25, 2017  9604 Patient is unable to ambulate due to her weakness and  significant leg swelling.  [HM]  [phone] Discussed with Dr. Alvino Chapel who will admit to med-surg.    [HM]    Clinical Course User Index [HM] [place] Sandoval, Dahlia Client, PA-C    Patient presents to the emergency department with worsening bilateral lower extremity edema. Anasarca on exam. Due to the significant swelling of her legs she is unable to move them on her own, stand or ambulate. Patient also noted to have a proximally 2 g drop in hemoglobin since beginning of the month. She denies sources of bleeding. Fecal occult is negative. This is likely contributing to her generalized weakness. Troponin negative, BNP within normal limits. Significant hypokalemia. Oral repletion started. Urinalysis pending. Patient will need admission for anasarca, inability to walk, hypokalemia and anemia.   The patient was discussed with and seen by Dr. Elesa Massed who agrees with the treatment plan.  7:14 AM UA without evidence of UTI.     Final Clinical Impressions(s) / ED Diagnoses   Final diagnoses:  Peripheral edema  Hypokalemia  Generalized weakness  Anemia, unspecified type    New Prescriptions New Prescriptions   No medications on file     Milta Deiters 08/25/17 8119

## 2017-08-25 NOTE — H&P (Signed)
Date: 08/25/2017               Patient Name:  Traci Meyer MRN: [id number]  DOB: [birth year] Age / Sex: 83 y.o., female   PCP: System, Pcp Not In         Medical Service: Internal Medicine Teaching Service         Attending Physician: Dr. Oswaldo Done, Marquita Palms, *    First Contact: Dr. Minda Meo Pager: [phone]  Second Contact: Dr. Earlene Plater Pager: [phone]       After Hours (After 5p/  First Contact Pager: [phone]  weekends / holidays): Second Contact Pager: [phone]   Chief Complaint: Leg swelling  History of Present Illness:  Ms. Traci Meyer is an 83 year old woman with PMH of HTN, HLD, Degenerative Arthritis, HFpEF (EF 60-65% by TTE 08/02/17), and lower extremity edema who presented to [facility] ED with worsened lower extremity edema and difficulty with ambulation. She is accompanied by one of her daughters.  Patient was recently admitted from 8/5-8/7 for progressive bilateral lower extremity edema since June 2018. Workup that admission included venous dopplers (negative for clot), CTA Chest (negative for PE, showed dilated pulmonary artery and non-specific patchy groundglass opacities bilaterally), TTE showed preserved left ventricular function ejection fraction 60-65%.  Grade 2 diastolic dysfunction.  Elevated pulmonary artery pressure 41 mm.  Increased RV systolic pressure consistent with moderate hypertension.  Normal left atrial size. Her lower extremity edema was managed with compression stockings and lasix was added to her home medications.  Patient states that she has been taking her lasix as prescribed which was initially helpful. She says her swelling has progressively returned and causing her legs to be sore. Prior to June she was able to ambulate "wherever she needed to go." Since her last hospitalization, she has mostly transported with the use of a wheelchair. She denies any dyspnea, orthopnea, PND, chest pain, palpitations, lightheadedness, dizziness, syncope, recent falls,  fevers, chills, or diaphoresis.   In the [facility] ED, initial vitals were: BP 116/62, Pulse 101, Temp 98.69F, RR 23, SpO2 95% on RA. Labwork was notable for Hgb 8.8 (10.4 on 8/5), K 3.0, Albumin 3.2, BNP 34, I-stat Troponin 0.02, LA 0.99, FOBT negative. [facility] service were initially consulted for admission and she was subsequently transferred to [facility] under IMTS for further management.  Meds:  Current Meds  Medication Sig  . aspirin 81 MG chewable tablet Chew by mouth daily.  . enalapril (VASOTEC) 20 MG tablet Take 20 mg by mouth 2 (two) times daily.  . furosemide (LASIX) 20 MG tablet Take 1 tablet (20 mg total) by mouth daily.  . hydrochlorothiazide (MICROZIDE) 12.5 MG capsule Take 12.5 mg by mouth daily.  Marland Kitchen levothyroxine (SYNTHROID, LEVOTHROID) 75 MCG tablet Take 75 mcg by mouth daily before breakfast.  . meloxicam (MOBIC) 15 MG tablet Take 15 mg by mouth daily.  . metoprolol succinate (TOPROL-XL) 25 MG 24 hr tablet Take 25 mg by mouth daily.  Marland Kitchen oxybutynin (DITROPAN) 5 MG tablet Take 5 mg by mouth daily.  . rosuvastatin (CRESTOR) 10 MG tablet Take 10 mg by mouth daily.     Allergies: Allergies as of 08/25/2017 - Review Complete 08/25/2017  Allergen Reaction Noted  . Penicillins  08/03/2017   Past Medical History:  Diagnosis Date  . Aortic atherosclerosis (HCC)   . Arthritis   . Hypertension     Family History:  Family History  Problem Relation Age of Onset  . Heart  disease Neg Hx      Social History:  Social History   Social History  . Marital status: Single    Spouse name: N/A  . Number of children: N/A  . Years of education: N/A   Social History Main Topics  . Smoking status: Former Games developer  . Smokeless tobacco: Never Used     Comment: 45 years ago briefly  . Alcohol use No  . Drug use: No  . Sexual activity: Not Asked   Other Topics Concern  . None   Social History Narrative  . None     Review of Systems: A complete ROS was negative  except as per HPI.    Physical Exam: Blood pressure 136/62, pulse (!) 55, temperature 97.7 F (36.5 C), temperature source Oral, resp. rate 16, height 5\' 2"  (1.575 m), weight 211 lb 3.2 oz (95.8 kg), SpO2 98 %. Physical Exam  Constitutional: She is oriented to person, place, and time. She appears well-developed and well-nourished. No distress.  HENT:  Head: Normocephalic and atraumatic.  Mouth/Throat: Oropharynx is clear and moist.  Eyes: EOM are normal.  Neck: Neck supple.  Cardiovascular: Regular rhythm and intact distal pulses.   No murmur heard. Bradycardic. DP pulses +2 bilaterally.  Pulmonary/Chest: Effort normal. No respiratory distress. She has no wheezes. She has no rales.  Abdominal: Soft. There is no tenderness.  Musculoskeletal:  +2 pitting edema bilateral legs without erythema. Mild tenderness to palpation of both legs R > L.   Neurological: She is alert and oriented to person, place, and time.  Skin: Skin is warm and dry. She is not diaphoretic. No erythema.  Dry, scaly skin on both LEs with hyperpigmentation.     EKG: personally reviewed my interpretation is NSR, Rate 90 bpm, 1st degree AV block, no acute ischemic changes  CXR: personally reviewed my interpretation is shallow inspiration, patient rotated, no effusion or consolidation, likely cardiomegaly  Assessment & Plan by Problem: Principal Problem:   Bilateral leg edema Active Problems:   Hypertension   Aortic atherosclerosis (HCC)   Anasarca   Chronic diastolic CHF (congestive heart failure) (HCC)   Hypokalemia  83 year old woman with PMH of HTN, HLD, Degenerative Arthritis, HFpEF (EF 60-65% by TTE 08/02/17), and lower extremity edema who presented with worsened lower extremity edema and difficulty with ambulation.  Bilateral Lower Extremity Edema with Deconditioning: Her LE edema is most likely from venous insufficiency with associated stasis dermatitis. She does have grade 2 diastolic dysfunction which  may also be contributing as she is mildly volume overloaded. She is not having any symptoms of CHF exacerbation at this time. She has tried compressive stockings at home without significant improvement. She occasionally elevates her legs at home, but it does not seem like she is elevating them high enough. Management of peripheral edema from venous stasis is generally conservative with compression stockings, leg elevation, and activity. She was started on lasix on her last admission with advice to d/c HCTZ. We will give her IV lasix for diuresis today and re-evaluate in the morning. - IV Lasix 40 mg once, monitor volume status and reassess diuretic needs in the morning [facility], elevate legs above level of the heart when sleeping or at rest - PT/OT evaluation; activity as tolerated with assistance  HFpEF: Currently stable without symptoms of exacerbation. Will resume home medications. - Continue Enalapril 20 mg BID - Continue Toprol XL 25 mg po qd  Hypokalemia: K 3.0. Received 40 mEq supplementation. Will give  additional 40 mEq and monitor BMET and Magnesium.  Hypothyroidism: Continue home Synthroid 75 mcg daily  CODE STATUS: Partial - patient states she would not want to be intubated.  Dispo: Admit patient to Observation with expected length of stay less than 2 midnights.  Signed: Darreld Mclean, MD 08/25/2017, 2:55 PM

## 2017-08-26 DIAGNOSIS — M25569 Pain in unspecified knee: Secondary | ICD-10-CM

## 2017-08-26 DIAGNOSIS — Z7982 Long term (current) use of aspirin: Secondary | ICD-10-CM | POA: Diagnosis not present

## 2017-08-26 DIAGNOSIS — I5032 Chronic diastolic (congestive) heart failure: Secondary | ICD-10-CM

## 2017-08-26 DIAGNOSIS — M21331 Wrist drop, right wrist: Secondary | ICD-10-CM | POA: Diagnosis present

## 2017-08-26 DIAGNOSIS — R531 Weakness: Secondary | ICD-10-CM | POA: Diagnosis not present

## 2017-08-26 DIAGNOSIS — I700 Atherosclerosis of aorta: Secondary | ICD-10-CM | POA: Diagnosis present

## 2017-08-26 DIAGNOSIS — I440 Atrioventricular block, first degree: Secondary | ICD-10-CM | POA: Diagnosis present

## 2017-08-26 DIAGNOSIS — Z791 Long term (current) use of non-steroidal anti-inflammatories (NSAID): Secondary | ICD-10-CM | POA: Diagnosis not present

## 2017-08-26 DIAGNOSIS — I5033 Acute on chronic diastolic (congestive) heart failure: Secondary | ICD-10-CM | POA: Diagnosis present

## 2017-08-26 DIAGNOSIS — I872 Venous insufficiency (chronic) (peripheral): Secondary | ICD-10-CM | POA: Diagnosis present

## 2017-08-26 DIAGNOSIS — E785 Hyperlipidemia, unspecified: Secondary | ICD-10-CM | POA: Diagnosis present

## 2017-08-26 DIAGNOSIS — M25559 Pain in unspecified hip: Secondary | ICD-10-CM | POA: Diagnosis not present

## 2017-08-26 DIAGNOSIS — E039 Hypothyroidism, unspecified: Secondary | ICD-10-CM | POA: Diagnosis present

## 2017-08-26 DIAGNOSIS — I110 Hypertensive heart disease with heart failure: Secondary | ICD-10-CM | POA: Diagnosis present

## 2017-08-26 DIAGNOSIS — R5381 Other malaise: Secondary | ICD-10-CM | POA: Diagnosis not present

## 2017-08-26 DIAGNOSIS — E876 Hypokalemia: Secondary | ICD-10-CM | POA: Diagnosis present

## 2017-08-26 DIAGNOSIS — Z87891 Personal history of nicotine dependence: Secondary | ICD-10-CM | POA: Diagnosis not present

## 2017-08-26 DIAGNOSIS — Z79899 Other long term (current) drug therapy: Secondary | ICD-10-CM | POA: Diagnosis not present

## 2017-08-26 DIAGNOSIS — Z880 Allergy status to penicillin: Secondary | ICD-10-CM | POA: Diagnosis not present

## 2017-08-26 LAB — COMPREHENSIVE METABOLIC PANEL
ALBUMIN: 2.8 g/dL — AB (ref 3.5–5.0)
ALK PHOS: 65 U/L (ref 38–126)
ALT: 19 U/L (ref 14–54)
AST: 42 U/L — AB (ref 15–41)
Anion gap: 5 (ref 5–15)
BUN: 9 mg/dL (ref 6–20)
CALCIUM: 8.8 mg/dL — AB (ref 8.9–10.3)
CHLORIDE: 105 mmol/L (ref 101–111)
CO2: 29 mmol/L (ref 22–32)
CREATININE: 0.49 mg/dL (ref 0.44–1.00)
GFR calc Af Amer: >60 mL/min (ref >60)
GFR calc non Af Amer: >60 mL/min (ref >60)
GLUCOSE: 86 mg/dL (ref 65–99)
Potassium: 3.4 mmol/L — ABNORMAL LOW (ref 3.5–5.1)
SODIUM: 139 mmol/L (ref 135–145)
Total Bilirubin: 0.5 mg/dL (ref 0.3–1.2)
Total Protein: 5.7 g/dL — ABNORMAL LOW (ref 6.5–8.1)

## 2017-08-26 LAB — CBC
HCT: 27.3 % — ABNORMAL LOW (ref 36.0–46.0)
Hemoglobin: 8.8 g/dL — ABNORMAL LOW (ref 12.0–15.0)
MCH: 30.1 pg (ref 26.0–34.0)
MCHC: 32.2 g/dL (ref 30.0–36.0)
MCV: 93.5 fL (ref 78.0–100.0)
Platelets: 330 10*3/uL (ref 150–400)
RBC: 2.92 MIL/uL — ABNORMAL LOW (ref 3.87–5.11)
RDW: 16.7 % — ABNORMAL HIGH (ref 11.5–15.5)
WBC: 5.3 10*3/uL (ref 4.0–10.5)

## 2017-08-26 LAB — MAGNESIUM: MAGNESIUM: 1.9 mg/dL (ref 1.7–2.4)

## 2017-08-26 MED ORDER — FUROSEMIDE 10 MG/ML IJ SOLN
40.0000 mg | Freq: Two times a day (BID) | INTRAMUSCULAR | Status: DC
  Administered 2017-08-26 – 2017-08-27 (×2): 40 mg via INTRAVENOUS
  Filled 2017-08-26 (×3): qty 4

## 2017-08-26 MED ORDER — POTASSIUM CHLORIDE CRYS ER 20 MEQ PO TBCR
40.0000 meq | EXTENDED_RELEASE_TABLET | Freq: Once | ORAL | Status: AC
  Administered 2017-08-26: 40 meq via ORAL
  Filled 2017-08-26: qty 2

## 2017-08-26 NOTE — Clinical Social Work Note (Signed)
Clinical Social Work Assessment  Patient Details  Name: Traci Meyer MRN: [id number] Date of Birth: 1931-05-04  Date of referral:  08/26/17               Reason for consult:  Facility Placement                Permission sought to share information with:  Facility Medical sales representativeContact Representative, Family Supports Permission granted to share information::  No  Name::     Traci BelliniCheryl, Traci Meyer  Agency::  SNFs  Relationship::  Daughter  Contact Information:     Housing/Transportation Living arrangements for the past 2 months:  Single Family Home Source of Information:  Adult Children Patient Interpreter Needed:  None Criminal Activity/Legal Involvement Pertinent to Current Situation/Hospitalization:  No - Comment as needed Significant Relationships:  Adult Children Lives with:  Adult Children Do you feel safe going back to the place where you live?  No Need for family participation in patient care:  Yes (Comment)  Care giving concerns:  CSW received consult for possible SNF placement at time of discharge. CSW spoke with patient's daughters Traci MaxwellCheryl and Traci Meyer regarding PT recommendation of SNF placement at time of discharge. Patient's daughters reported that patient lives with Traci Meyer but she is currently unable to care for patient at their home given patient's current physical needs and fall risk. Patient's daughters expressed understanding of PT recommendation and is agreeable to SNF placement at time of discharge. CSW to continue to follow and assist with discharge planning needs.   Social Worker assessment / plan:  CSW spoke with patient's daughters concerning [facility] before returning home.  Employment status:  Retired Health and safety inspectornsurance information:  Armed forces operational officerMedicare, Medicaid In [place] PT Recommendations:  Skilled Nursing Facility Information / Referral to community resources:  Skilled Nursing Facility  Patient/Family's Response to care:  Patient's daughters recognizes need for rehab before  returning home and [place]. She reported preference for [facility]. CSW explained potential insurance barrier. Patient must have 3 inpatient nights to qualify to go to [facility]. Otherwise, will need to find a Medicaid placement.   Patient/Family's Understanding of and Emotional Response to Diagnosis, Current Treatment, and Prognosis:  Patient/family is realistic regarding therapy needs and expressed being hopeful for SNF placement. Patient's daughters expressed understanding of CSW role and discharge process as well as medical condition. No questions/concerns about plan or treatment.    Emotional Assessment Appearance:  Appears stated age Attitude/Demeanor/Rapport:  Unable to Assess Affect (typically observed):  Unable to Assess Orientation:  Oriented to Self, Oriented to Place Alcohol / Substance use:  Not Applicable Psych involvement (Current and /or in the community):  No (Comment)  Discharge Needs  Concerns to be addressed:  Care Coordination Readmission within the last 30 days:  Yes Current discharge risk:  None Barriers to Discharge:  Continued Medical Work up   Traci Meyer, LCSWA 08/26/2017, 2:48 PM

## 2017-08-26 NOTE — Evaluation (Signed)
Physical Therapy Evaluation Patient Details Name: Traci Meyer MRN: [id number] DOB: 01/18/1931 Today's Date: 08/26/2017   History of Present Illness  pt is an 83 y/o female with pmh significant for HTN, degenerative arthrities, HFpEF and LE edema, presenting via [facility] ED for worsening LE edema and decreasing ability to transfer well.  Clinical Impression  Pt admitted with/for above complications.  Pt needing at least mod to max for basic mobility.  Pt currently limited functionally due to the problems listed. ( See problems list.)   Pt will benefit from PT to maximize function and safety in order to get ready for next venue listed below.     Follow Up Recommendations SNF;Supervision/Assistance - 24 hour    Equipment Recommendations  Other (comment);None recommended by PT (TBA)    Recommendations for Other Services       Precautions / Restrictions Precautions Precautions: Fall      Mobility  Bed Mobility Overal bed mobility: Needs Assistance Bed Mobility: Supine to Sit     Supine to sit: Mod assist     General bed mobility comments: assist to get trunk up and forward.  Pt then slowly scooted to EOB  Transfers Overall transfer level: Needs assistance Equipment used: 1 person hand held assist Transfers: Squat Pivot Transfers;Sit to/from Stand Sit to Stand: Mod assist   Squat pivot transfers: Max assist     General transfer comment: pt stood slowly from bed, but unable to take a step without assist using face to face assist  Ambulation/Gait                Stairs            Wheelchair Mobility    Modified Rankin (Stroke Patients Only)       Balance Overall balance assessment: Needs assistance Sitting-balance support: Single extremity supported;No upper extremity supported Sitting balance-Leahy Scale: Fair     Standing balance support: Bilateral upper extremity supported Standing balance-Leahy Scale: Poor                                Pertinent Vitals/Pain Pain Assessment: Faces Faces Pain Scale: Hurts whole lot Pain Location: R hip and knee joints Pain Descriptors / Indicators: Sore;Sharp;Guarding;Grimacing Pain Intervention(s): Limited activity within patient's tolerance;Monitored during session    Home Living Family/patient expects to be discharged to:: Skilled nursing facility Living Arrangements: Children;Other (Comment) ([place]               Additional Comments: pt has been at a transfer-w/c level for months.  There is no ramp into the house and pt's daughter has had to set up transport to get pt to appointments    Prior Function Level of Independence: Needs assistance   Gait / Transfers Assistance Needed: Assisted transfers to w/c           Hand Dominance        Extremity/Trunk Assessment   Upper Extremity Assessment Upper Extremity Assessment: Generalized weakness;Overall WFL for tasks assessed (limited by bil shoulder ROM)    Lower Extremity Assessment Lower Extremity Assessment: Generalized weakness;RLE deficits/detail;LLE deficits/detail RLE Deficits / Details: severely limited movement/ROM with joint grinding/crepitus and generally weak. RLE: Unable to fully assess due to pain RLE Coordination: decreased fine motor LLE Deficits / Details: functional movement and stength with general proximal weakness LLE Coordination: decreased fine motor    Cervical / Trunk Assessment Cervical / Trunk Assessment: Kyphotic  Communication  Cognition Arousal/Alertness: Awake/alert Behavior During Therapy: WFL for tasks assessed/performed Overall Cognitive Status: Within Functional Limits for tasks assessed                                        General Comments      Exercises     Assessment/Plan    PT Assessment Patient needs continued PT services  PT Problem List Decreased strength;Decreased range of motion;Decreased activity tolerance;Decreased  mobility;Decreased knowledge of use of DME;Pain       PT Treatment Interventions Functional mobility training;Therapeutic activities;Therapeutic exercise;Balance training;Patient/family education;DME instruction    PT Goals (Current goals can be found in the Care Plan section)  Acute Rehab PT Goals Patient Stated Goal: pt wanting to get back home after rehab. PT Goal Formulation: With patient/family Time For Goal Achievement: 09/09/17 Potential to Achieve Goals: Fair    Frequency Min 3X/week   Barriers to discharge Inaccessible home environment      Co-evaluation               AM-PAC PT "6 Clicks" Daily Activity  Outcome Measure Difficulty turning over in bed (including adjusting bedclothes, sheets and blankets)?: Unable Difficulty moving from lying on back to sitting on the side of the bed? : Unable Difficulty sitting down on and standing up from a chair with arms (e.g., wheelchair, bedside commode, etc,.)?: Unable Help needed moving to and from a bed to chair (including a wheelchair)?: A Lot Help needed walking in hospital room?: Total Help needed climbing 3-5 steps with a railing? : Total 6 Click Score: 7    End of Session   Activity Tolerance: Patient limited by pain Patient left: in chair;with call bell/phone within reach;with chair alarm set Nurse Communication: Mobility status PT Visit Diagnosis: Muscle weakness (generalized) (M62.81);Pain;Other abnormalities of gait and mobility (R26.89) Pain - Right/Left: Right Pain - part of body: Hip;Knee    Time: 1610-9604 PT Time Calculation (min) (ACUTE ONLY): 40 min   Charges:   PT Evaluation $PT Eval Moderate Complexity: 1 Mod PT Treatments $Therapeutic Activity: 23-37 mins   PT G Codes:   PT G-Codes **NOT FOR INPATIENT CLASS** Functional Assessment Tool Used: AM-PAC 6 Clicks Basic Mobility;Clinical judgement Functional Limitation: Mobility: Walking and moving around Mobility: Walking and Moving Around  Current Status (V4098): At least 40 percent but less than 60 percent impaired, limited or restricted Mobility: Walking and Moving Around Goal Status [phone]): At least 20 percent but less than 40 percent impaired, limited or restricted    08/26/2017  Plush Bing, PT [phone]  (pager)  Traci Meyer 08/26/2017, 2:23 PM

## 2017-08-26 NOTE — NC FL2 (Signed)
[place] MEDICAID FL2 LEVEL OF CARE SCREENING TOOL     IDENTIFICATION  Patient Name: Traci Meyer Birthdate: [birth year] Sex: female Admission Date (Current Location): 08/25/2017  [facility] and [place] Number:  Producer, television/film/video and Address:  [place], [address], [place], [place] [address]      Provider Number: [id number]  Attending Physician Name and Address:  Tyson Alias, *  Relative Name and Phone Number:       Current Level of Care: Hospital Recommended Level of Care: Skilled Nursing Facility Prior Approval Number:    Date Approved/Denied:   PASRR Number:   [id number]   Discharge Plan: SNF    Current Diagnoses: Patient Active Problem List   Diagnosis Date Noted  . Anasarca 08/25/2017  . Chronic diastolic CHF (congestive heart failure) (HCC) 08/25/2017  . Hypokalemia 08/25/2017  . Chest pain 08/01/2017  . Bilateral leg edema 08/01/2017  . Arthritis 08/01/2017  . Hypertension 08/01/2017  . Aortic atherosclerosis (HCC) 08/01/2017    Orientation RESPIRATION BLADDER Height & Weight     Self, Place  Normal Incontinent, External catheter Weight: 96 kg (211 lb 10.3 oz) Height:  5\' 2"  (157.5 cm)  BEHAVIORAL SYMPTOMS/MOOD NEUROLOGICAL BOWEL NUTRITION STATUS      Continent Diet (Please see DC Summary)  AMBULATORY STATUS COMMUNICATION OF NEEDS Skin   Extensive Assist Verbally Normal                       Personal Care Assistance Level of Assistance  Bathing, Feeding, Dressing Bathing Assistance: Maximum assistance Feeding assistance: Limited assistance Dressing Assistance: Limited assistance     Functional Limitations Info             SPECIAL CARE FACTORS FREQUENCY  PT (By licensed PT)     PT Frequency: 5x/week              Contractures      Additional Factors Info  Code Status, Allergies Code Status Info: Partial Allergies Info: Penicillins           Current Medications  (08/26/2017):  This is the current hospital active medication list Current Facility-Administered Medications  Medication Dose Route Frequency Provider Last Rate Last Dose  . 0.9 %  sodium chloride infusion  250 mL Intravenous PRN Noralee Stain Chahn-Yang, DO      . aspirin chewable tablet 81 mg  81 mg Oral Daily Noralee Stain Chahn-Yang, DO   81 mg at 08/26/17 2841  . enalapril (VASOTEC) tablet 20 mg  20 mg Oral BID Noralee Stain Chahn-Yang, DO   20 mg at 08/26/17 3244  . enoxaparin (LOVENOX) injection 40 mg  40 mg Subcutaneous Q24H Noralee Stain Chahn-Yang, DO   40 mg at 08/26/17 0102  . furosemide (LASIX) injection 40 mg  40 mg Intravenous BID Toney Rakes, MD      . levothyroxine (SYNTHROID, LEVOTHROID) tablet 75 mcg  75 mcg Oral QAC breakfast Noralee Stain Chahn-Yang, DO   75 mcg at 08/26/17 7253  . metoprolol succinate (TOPROL-XL) 24 hr tablet 25 mg  25 mg Oral Daily Noralee Stain Chahn-Yang, DO   25 mg at 08/26/17 6644  . oxybutynin (DITROPAN) tablet 5 mg  5 mg Oral Daily Noralee Stain Chahn-Yang, DO   5 mg at 08/26/17 0347  . rosuvastatin (CRESTOR) tablet 10 mg  10 mg Oral Daily Noralee Stain Chahn-Yang, DO   10 mg at 08/26/17 4259  . sodium chloride flush (NS)  0.9 % injection 3 mL  3 mL Intravenous Q12H Noralee StainChoi, Jennifer Chahn-Yang, DO   3 mL at 08/26/17 0940  . sodium chloride flush (NS) 0.9 % injection 3 mL  3 mL Intravenous PRN Jordan Hawkshoi, Jennifer Chahn-Yang, DO         Discharge Medications: Please see discharge summary for a list of discharge medications.  Relevant Imaging Results:  Relevant Lab Results:   Additional Information SSN: [id number]28  Maksymilian Mabey S [place], [place]

## 2017-08-26 NOTE — Evaluation (Signed)
Occupational Therapy Evaluation Patient Details Name: Traci Meyer MRN: [id number] DOB: 1931/05/01 Today's Date: 08/26/2017    History of Present Illness pt is an 83 y/o female with pmh significant for HTN, degenerative arthrities, HFpEF and LE edema, presenting via [facility] ED for worsening LE edema and decreasing ability to transfer well.   Clinical Impression   Pt admitted with above and presents to OT therapy with deficits impacting her ability to complete ADLs and prior level of independence.  Pt currently limited secondary to knee and shoulder crepitus as well as new radial nerve weakness at wrist and hand of dominant UE.  Pt will benefit from OT acutely to increase independence with ADLs and functional transfers to prepare for d/c to next venue of care.     Follow Up Recommendations  SNF;Supervision/Assistance - 24 hour    Equipment Recommendations  Other (comment) (TBD)    Recommendations for Other Services       Precautions / Restrictions Precautions Precautions: Fall      Mobility Bed Mobility Overal bed mobility: Needs Assistance Bed Mobility: Supine to Sit     Supine to sit: Mod assist     General bed mobility comments: assist to get trunk up and forward.  Pt then slowly scooted to EOB  Transfers Overall transfer level: Needs assistance Equipment used: 1 person hand held assist Transfers: Squat Pivot Transfers;Sit to/from Stand Sit to Stand: Mod assist   Squat pivot transfers: Max assist     General transfer comment: pt stood slowly from bed, but unable to take a step without assist using face to face assist    Balance Overall balance assessment: Needs assistance Sitting-balance support: Single extremity supported;No upper extremity supported Sitting balance-Leahy Scale: Fair     Standing balance support: Bilateral upper extremity supported Standing balance-Leahy Scale: Poor                             ADL either performed or assessed  with clinical judgement   ADL Overall ADL's : Needs assistance/impaired             Lower Body Bathing: +2 for physical assistance;Sit to/from stand       Lower Body Dressing: +2 for physical assistance;Sit to/from stand   Toilet Transfer: Maximal assistance;BSC;Stand-pivot;+2 for safety/equipment           Functional mobility during ADLs: +2 for physical assistance;+2 for safety/equipment;Moderate assistance                    Pertinent Vitals/Pain Pain Assessment: Faces Faces Pain Scale: Hurts whole lot Pain Location: R hip and knee joints Pain Descriptors / Indicators: Sore;Sharp;Guarding;Grimacing Pain Intervention(s): Limited activity within patient's tolerance;Monitored during session     Hand Dominance Right   Extremity/Trunk Assessment Upper Extremity Assessment Upper Extremity Assessment: Generalized weakness;RUE deficits/detail RUE Deficits / Details: radial nerve weakness ?palsy with weak supination and wrist flexion only to neutral in gravity eliminated RUE: Unable to fully assess due to immobilization RUE Coordination: decreased gross motor;decreased fine motor   Lower Extremity Assessment Lower Extremity Assessment: Generalized weakness;RLE deficits/detail;LLE deficits/detail RLE Deficits / Details: severely limited movement/ROM with joint grinding/crepitus and generally weak. RLE: Unable to fully assess due to pain RLE Coordination: decreased fine motor LLE Deficits / Details: functional movement and stength with general proximal weakness LLE Coordination: decreased fine motor   Cervical / Trunk Assessment Cervical / Trunk Assessment: Kyphotic   Communication  Cognition Arousal/Alertness: Awake/alert Behavior During Therapy: WFL for tasks assessed/performed Overall Cognitive Status: Within Functional Limits for tasks assessed                                                Home Living Family/patient expects to be  discharged to:: Skilled nursing facility Living Arrangements: Children;Other (Comment) (in [place])                               Additional Comments: pt has been at a transfer-w/c level for months.  There is no ramp into the house and pt's daughter has had to set up transport to get pt to appointments      Prior Functioning/Environment Level of Independence: Needs assistance  Gait / Transfers Assistance Needed: Assisted transfers to w/c ADL's / Homemaking Assistance Needed: assistance with sponge bath and would wear gown and bedroom slippers            OT Problem List: Decreased strength;Decreased range of motion;Impaired balance (sitting and/or standing);Decreased coordination;Impaired UE functional use      OT Treatment/Interventions: Self-care/ADL training;Therapeutic activities;Patient/family education;Neuromuscular education;Therapeutic exercise    OT Goals(Current goals can be found in the care plan section) Acute Rehab OT Goals Patient Stated Goal: pt wanting to get back home after rehab. OT Goal Formulation: With patient Time For Goal Achievement: 09/09/17 Potential to Achieve Goals: Fair  OT Frequency: Min 2X/week   Barriers to D/C: Decreased caregiver support             AM-PAC PT "6 Clicks" Daily Activity     Outcome Measure Help from another person eating meals?: A Little Help from another person taking care of personal grooming?: A Little Help from another person toileting, which includes using toliet, bedpan, or urinal?: Total Help from another person bathing (including washing, rinsing, drying)?: Total Help from another person to put on and taking off regular upper body clothing?: A Lot Help from another person to put on and taking off regular lower body clothing?: Total 6 Click Score: 11   End of Session Nurse Communication: Mobility status  Activity Tolerance: Patient tolerated treatment well;Patient limited by pain Patient left: in  chair;with call bell/phone within reach  OT Visit Diagnosis: Other abnormalities of gait and mobility (R26.89);Muscle weakness (generalized) (M62.81)                Time: 4540-9811 OT Time Calculation (min): 32 min Charges:  OT General Charges $OT Visit: 1 Visit OT Evaluation $OT Eval Moderate Complexity: 1 Mod OT Treatments $Self Care/Home Management : 8-22 mins G-Codes: OT G-codes **NOT FOR INPATIENT CLASS** Functional Assessment Tool Used: AM-PAC 6 Clicks Daily Activity Functional Limitation: Self care Self Care Current Status (B1478): At least 60 percent but less than 80 percent impaired, limited or restricted Self Care Goal Status (G9562): At least 20 percent but less than 40 percent impaired, limited or restricted    Rosalio Loud, [phone] 08/26/2017, 4:07 PM

## 2017-08-26 NOTE — Progress Notes (Signed)
Physical Therapy Treatment Patient Details Name: Traci DivineDorothy P Meyer MRN: [id number] DOB: 03-18-1931 Today's Date: 08/26/2017    History of Present Illness pt is an 83 y/o female with pmh significant for HTN, degenerative arthrities, HFpEF and LE edema, presenting via [facility] ED for worsening LE edema and decreasing ability to transfer well.    PT Comments    Pt was familiarized to the STEDY and STEDY was used to assist her to stand x2 to simulate how the equipment could be used by nursing to get Traci Meyer back to bed.   Follow Up Recommendations  SNF;Supervision/Assistance - 24 hour     Equipment Recommendations  Other (comment);None recommended by PT    Recommendations for Other Services       Precautions / Restrictions Precautions Precautions: Fall    Mobility  Bed Mobility Overal bed mobility: Needs Assistance Bed Mobility: Supine to Sit     Supine to sit: Mod assist     General bed mobility comments: assist to get trunk up and forward.  Pt then slowly scooted to EOB  Transfers Overall transfer level: Needs assistance Equipment used: 2 person hand held assist Transfers: Sit to/from Stand Sit to Stand: Min guard;+2 safety/equipment   Squat pivot transfers: Max assist     General transfer comment: Check to see if pt could get back into bed using STEDY wit  Ambulation/Gait                 Stairs            Wheelchair Mobility    Modified Rankin (Stroke Patients Only)       Balance Overall balance assessment: Needs assistance Sitting-balance support: Single extremity supported;Bilateral upper extremity supported Sitting balance-Leahy Scale: Fair Sitting balance - Comments: pt tends to want to flex forward   Standing balance support: Bilateral upper extremity supported Standing balance-Leahy Scale: Poor                              Cognition Arousal/Alertness: Awake/alert Behavior During Therapy: WFL for tasks  assessed/performed Overall Cognitive Status: Within Functional Limits for tasks assessed                                        Exercises      General Comments        Pertinent Vitals/Pain Pain Assessment: Faces Faces Pain Scale: Hurts whole lot Pain Location: R hip and knee joints Pain Descriptors / Indicators: Sore;Sharp;Guarding;Grimacing Pain Intervention(s): Monitored during session;Limited activity within patient's tolerance    Home Living Family/patient expects to be discharged to:: Skilled nursing facility Living Arrangements: Children;Other (Comment) (in [place])             Additional Comments: pt has been at a transfer-w/c level for months.  There is no ramp into the house and pt's daughter has had to set up transport to get pt to appointments    Prior Function Level of Independence: Needs assistance  Gait / Transfers Assistance Needed: Assisted transfers to w/c ADL's / Homemaking Assistance Needed: assistance with sponge bath and would wear gown and bedroom slippers     PT Goals (current goals can now be found in the care plan section) Acute Rehab PT Goals Patient Stated Goal: pt wanting to get back home after rehab. PT Goal Formulation: With patient/family Time For Goal Achievement:  09/09/17 Potential to Achieve Goals: Fair Progress towards PT goals: Progressing toward goals    Frequency    Min 3X/week      PT Plan Current plan remains appropriate    Co-evaluation              AM-PAC PT "6 Clicks" Daily Activity  Outcome Measure  Difficulty turning over in bed (including adjusting bedclothes, sheets and blankets)?: Unable Difficulty moving from lying on back to sitting on the side of the bed? : Unable Difficulty sitting down on and standing up from a chair with arms (e.g., wheelchair, bedside commode, etc,.)?: Unable Help needed moving to and from a bed to chair (including a wheelchair)?: A Lot Help needed walking in  hospital room?: Total Help needed climbing 3-5 steps with a railing? : Total 6 Click Score: 7    End of Session   Activity Tolerance: Patient limited by pain Patient left: in chair;with call bell/phone within reach;with chair alarm set (pt wishing to stay for a while  in the chair.) Nurse Communication: Mobility status PT Visit Diagnosis: Muscle weakness (generalized) (M62.81);Pain;Other abnormalities of gait and mobility (R26.89) Pain - Right/Left: Right Pain - part of body: Hip;Knee     Time: 1520-1530 PT Time Calculation (min) (ACUTE ONLY): 10 min  Charges:  $Therapeutic Activity: 8-22 mins                    G Codes:  Functional Assessment Tool Used: AM-PAC 6 Clicks Basic Mobility;Clinical judgement Functional Limitation: Mobility: Walking and moving around Mobility: Walking and Moving Around Current Status (U0454): At least 40 percent but less than 60 percent impaired, limited or restricted Mobility: Walking and Moving Around Goal Status [phone]): At least 20 percent but less than 40 percent impaired, limited or restricted    08/26/2017  Lagro Bing, PT [phone]  (pager)   Eliseo Gum Loras Grieshop 08/26/2017, 4:34 PM

## 2017-08-26 NOTE — Progress Notes (Addendum)
S: Paged by physical therapy for concern of right forearm, wrist, and hand weakness. Per patient this occurred on Monday without trauma to the arm or hand. She denies pain or tingling, but endorses numbness in her thumb and first finger. She does not have weakness or numbness else where.  O: Strength at right wrist joint on extension 0/5,  Decreased ability to supinate right forearm, sensation decreased on right finger and thumb   A+P Possible radial nerve palsy due to localized findings. The patient doesn't exhibit other focal neurological deficits, but will obtain MRI brain to r/o CVA.  Juluis MireSam Camree Wigington, MD Internal Medicine PGY1 Pager # [phone]

## 2017-08-26 NOTE — Progress Notes (Signed)
   Subjective: Patient seen and examined, she was accompanied by her daughter. Patient states that she has also been having hip and knee pain. No other complaints at this time.   Objective:  Vital signs in last 24 hours: Vitals:   08/25/17 1041 08/25/17 1125 08/25/17 2126 08/26/17 0517  BP: (!) 124/52 136/62 (!) 115/46 (!) 134/56  Pulse: (!) 51 (!) 55 (!) 56 61  Resp: 16  17 17   Temp: 97.7 F (36.5 C)  98.7 F (37.1 C) 98.7 F (37.1 C)  TempSrc: Oral  Oral Oral  SpO2: 98%  96% 100%  Weight: 211 lb 3.2 oz (95.8 kg)   211 lb 10.3 oz (96 kg)  Height: 5\' 2"  (1.575 m)      General: Laying in bed comfortably, NAD HEENT: [place]/AT, EOMI, no scleral icterus Cardiac: RRR, No R/M/G appreciated Pulm: normal effort, CTAB Abd: soft, non tender, non distended, BS normal Ext: extremities well perfused, +2 pitting edema in bilateral lower extremities, +statis dermatitis Neuro: alert and oriented X3, cranial nerves II-XII grossly intact   Assessment/Plan:  Principal Problem:   Bilateral leg edema Active Problems:   Hypertension   Aortic atherosclerosis (HCC)   Anasarca   Chronic diastolic CHF (congestive heart failure) (HCC)   Hypokalemia  Bilateral Lower Extremity Edema with Deconditioning HFpEF Likely secondary to HFpEF and volume overload. Most recent ECHO 07/2017 showed preserved left ventricular function ejection fraction 60-65% with Grade 2 diastolic dysfunction. No cardiopulmonary symptoms of CHF exacerbation at this time, but has had a 15 lb weight gain (196>>212) since discharge on 08/03/2017.  - IV Lasix 40 mg BID, hold PO lasix - Monitor volume status: I/Os, daily weights - Supportive treatments such as ted hose and leg elevation - PT/OT evaluation - Continue Enalapril 20 mg BID - Continue Toprol XL 25 mg po qd  Hypokalemia: Serum potassium 3.0 on admission >>3.4 today after repletion. Magnesium 1.9.  -40 mEq supplementation  -Repeat BMET and Magnesium in  AM  Hypothyroidism: Continue home Synthroid 75 mcg daily  Dispo: Anticipated discharge in approximately 2-3 days.   Toney RakesLacroce, Leighann Amadon J, MD 08/26/2017, 11:36 AM Pager: [phone]

## 2017-08-26 NOTE — Progress Notes (Signed)
Patient's daughter, Elnita MaxwellCheryl, called CSW with Medicare information.   Medicare A/B: [id number]  Loyal Bubaadia Thoren Hosang LCSWA [phone]

## 2017-08-27 DIAGNOSIS — I110 Hypertensive heart disease with heart failure: Principal | ICD-10-CM

## 2017-08-27 DIAGNOSIS — Z79899 Other long term (current) drug therapy: Secondary | ICD-10-CM

## 2017-08-27 DIAGNOSIS — M21331 Wrist drop, right wrist: Secondary | ICD-10-CM

## 2017-08-27 DIAGNOSIS — E785 Hyperlipidemia, unspecified: Secondary | ICD-10-CM

## 2017-08-27 LAB — BASIC METABOLIC PANEL
Anion gap: 8 (ref 5–15)
BUN: 7 mg/dL (ref 6–20)
CALCIUM: 9.3 mg/dL (ref 8.9–10.3)
CO2: 31 mmol/L (ref 22–32)
Chloride: 100 mmol/L — ABNORMAL LOW (ref 101–111)
Creatinine, Ser: 0.54 mg/dL (ref 0.44–1.00)
GFR calc Af Amer: >60 mL/min (ref >60)
Glucose, Bld: 92 mg/dL (ref 65–99)
Potassium: 3.5 mmol/L (ref 3.5–5.1)
Sodium: 139 mmol/L (ref 135–145)

## 2017-08-27 LAB — MAGNESIUM: Magnesium: 1.8 mg/dL (ref 1.7–2.4)

## 2017-08-27 MED ORDER — FUROSEMIDE 10 MG/ML IJ SOLN: 40.0000 mg | Freq: Every day | INTRAMUSCULAR | Status: DC

## 2017-08-27 MED ORDER — POLYETHYLENE GLYCOL 3350 17 G PO PACK
17.0000 g | PACK | Freq: Every day | ORAL | Status: DC | PRN
  Administered 2017-08-27: 17 g via ORAL
  Filled 2017-08-27: qty 1

## 2017-08-27 NOTE — Progress Notes (Signed)
   Subjective: Patient seen and examined. She states that her lower extremity pain and discomfort have improved. She states that her right wrist and hand weakness occurred on Monday after waking from sleep. No trauma or inciting event that caused the weakness. She denies neck pain, tingling, or pain associated with the weakness.   Objective:  Vital signs in last 24 hours: Vitals:   08/26/17 2028 08/26/17 2054 08/26/17 2147 08/27/17 0537  BP: (!) 136/51 (!) 140/52 (!) 122/57 (!) 112/51  Pulse: (!) 59 (!) 59 78 (!) 58  Resp:  18  18  Temp:  97.8 F (36.6 C)  98.2 F (36.8 C)  TempSrc:  Oral  Oral  SpO2: 100% 98% 97% 99%  Weight:    201 lb 1.6 oz (91.2 kg)  Height:       General: Laying in bed comfortably, NAD HEENT: [place]/AT, EOMI, no scleral icterus Cardiac: RRR, No R/M/G appreciated Pulm: normal effort, CTAB Abd: soft, non tender, non distended, BS normal Ext: extremities well perfused, minimal peripheral edema in b/l LEs, +statis dermatitis Neuro: alert and oriented X3, cranial nerves II-XII grossly intact, hyporeflexic right radial DTR, 0/5 strength with right wrist extension, 0/5 right finger abduction, 2+ biceps DTR on right   Assessment/Plan:  Principal Problem:   Bilateral leg edema Active Problems:   Hypertension   Aortic atherosclerosis (HCC)   Anasarca   Chronic diastolic CHF (congestive heart failure) (HCC)   Hypokalemia   Acute on Chronic HFpEF exacerbation Most recent ECHO 07/2017 showed preserved left ventricular function ejection fraction 60-65% with Grade 2 diastolic dysfunction. No cardiopulmonary symptoms of CHF exacerbation at this time, but has had a 15 lb weight gain (196>>212) since discharge on 08/03/2017 and lower extremity edema. Since starting IV lasix has had 10 pound weight loss in 24 hours, with 24 hour urine output -3278 liters, and -4335 liters since admission.  - Decrease IV Lasix 40 mg daily, holding afternoon dose -Transition to PO lasix tomorrow   - Monitor volume status: I/Os, daily weights - PT/OT evaluation - Continue Enalapril 20 mg BID - Continue Toprol XL 25 mg po qd  Right wrist and hand weakness  Focal neurological deficit consistent with radial and ulnar nerve. Hyporeflexia more consistent with lower motor neuron/peripheral deficit, but pt has significant risk factors for CVA including HTN and HLD.  -MRI Brain ordered, follow up results -PT/OT   Hypokalemia: Serum potassium 3.5 today. Magnesium 1.8.  -Repeat BMET and Magnesium in AM  Hypothyroidism: Continue home Synthroid 75 mcg daily  Dispo: Anticipated discharge in approximately 1-2 days.   Toney RakesLacroce, Desani Sprung J, MD 08/27/2017, 8:30 AM Pager: [phone]

## 2017-08-27 NOTE — Progress Notes (Signed)
Occupational Therapy Treatment Patient Details Name: Traci Meyer MRN: [id number] DOB: [birth year] Today's Date: 08/27/2017    History of present illness pt is an 83 y/o female with pmh significant for HTN, degenerative arthrities, HFpEF and LE edema, presenting via [facility] ED for worsening LE edema and decreasing ability to transfer well.   OT comments  Pt states "I think I slept on my arm funny that night". Pt with 1/5 wrist extension, which appears to be improving form initial assessment. Recommend R wrist cock-up splint to support R wrist. If pt does not gain movement, she may eventually need a radial nerve palsy splint. Placed order for wrist cock-up splint, however discussed need to move IV with nursing before splint is applied. Pt not mobilized OOB due to pt being scheduled to go for MRI. Will follow acutely to address established goals, Continue to recommend rehab at [facility].   Follow Up Recommendations  SNF;Supervision/Assistance - 24 hour    Equipment Recommendations    TBA at SNF   Recommendations for Other Services      Precautions / Restrictions Precautions Precautions: Fall                                                     ADL either performed or assessed with clinical judgement   ADL Overall ADL's : Needs assistance/impaired Eating/Feeding: Minimal assistance Eating/Feeding Details (indicate cue type and reason): Pt states she is unable to use her R hand at [facility] time to feed self. May be able to feed self with splint adn tubing     Upper Body Bathing: Minimal assistance Upper Body Bathing Details (indicate cue type and reason): using L hand    "I want to be ale to use my hand, it just won't cooperate"                             Vision       Perception     Praxis      Cognition Arousal/Alertness: Awake/alert Behavior During Therapy: WFL for tasks assessed/performed Overall Cognitive Status: Within Functional Limits for tasks  assessed                                          Exercises Exercises: Other exercises Other Exercises Other Exercises: R wrist extension AA/PROM x 10 Other Exercises: R digit extension - place and hold x 10 Other Exercises: Education on keeping R hand supported on pillow to avoid over stretch of wrist extensors   Shoulder Instructions       General Comments  R wrist extension appears improved form initial resident note 8/30. Pt with 4/5 supination/pronation/ 4/5 grip strength. Trace finger extnesion    Pertinent Vitals/ Pain       Pain Assessment: Faces Faces Pain Scale: Hurts little more Pain Location: R hip and knee joints Pain Descriptors / Indicators: Sore;Sharp;Guarding;Grimacing Pain Intervention(s): Limited activity within patient's tolerance  Home Living                                          Prior Functioning/Environment  Frequency  Min 2X/week        Progress Toward Goals  OT Goals(current goals can now be found in the care plan section)  Progress towards OT goals: Progressing toward goals  Acute Rehab OT Goals Patient Stated Goal: pt wanting to get back home after rehab. OT Goal Formulation: With patient Time For Goal Achievement: 09/09/17 Potential to Achieve Goals: Fair ADL Goals Pt Will Perform Lower Body Bathing: with min assist Pt Will Perform Lower Body Dressing: with min assist Pt Will Transfer to Toilet: with min assist Pt/caregiver will Perform Home Exercise Program: Right Upper extremity;With minimal assist;With written HEP provided;Increased strength Additional ADL Goal #1: Pt will tolerate R wrist cock up splint x 2 hours wihtout complications Additional ADL Goal #2: Pt will independently verbalize understanding of appropriate positioning R wrist to prevent overstretch of extensors  Plan Discharge plan remains appropriate    Co-evaluation                 AM-PAC PT "6 Clicks"  Daily Activity     Outcome Measure   Help from another person eating meals?: A Little Help from another person taking care of personal grooming?: A Little Help from another person toileting, which includes using toliet, bedpan, or urinal?: A Lot Help from another person bathing (including washing, rinsing, drying)?: A Lot Help from another person to put on and taking off regular upper body clothing?: A Lot Help from another person to put on and taking off regular lower body clothing?: Total 6 Click Score: 13    End of Session    OT Visit Diagnosis: Other abnormalities of gait and mobility (R26.89);Muscle weakness (generalized) (M62.81)   Activity Tolerance Patient tolerated treatment well   Patient Left in bed;with call bell/phone within reach;with bed alarm set   Nurse Communication Other (comment) (need for R wrist cock - up splint/need to move IV)        Time: 1335-1400 OT Time Calculation (min): 25 min  Charges: OT General Charges $OT Visit: 1 Visit OT Treatments $Therapeutic Activity: 23-37 mins  [facility] Marisal Swarey, OT/L  [phone] 08/27/2017   Claudia Alvizo,HILLARY 08/27/2017, 2:36 PM

## 2017-08-27 NOTE — Progress Notes (Signed)
Orthopedic Tech Progress Note Patient Details:  Traci DivineDorothy P Meyer 1931-09-26 [id number]  Ortho Devices Type of Ortho Device: Velcro wrist splint Ortho Device/Splint Location: drop off Ortho Device/Splint Interventions: Application   Saul FordyceJennifer C Junius Faucett 08/27/2017, 4:51 PM

## 2017-08-27 NOTE — Discharge Summary (Addendum)
Name: Traci DivineDorothy P Pokorney MRN: [id number] DOB: 1931-02-14 83 y.o. PCP: System, Pcp Not In  Date of Admission: 08/25/2017  1:34 AM Date of Discharge: 08/29/2017 Attending Physician: Tyson AliasVincent, Duncan Thomas, *  Discharge Diagnosis: 1. Acute on Chronic Diastolic Heart Failure Principal Problem:   Acute on chronic diastolic heart failure (HCC) Active Problems:   Hypertension   Aortic atherosclerosis (HCC)   Anasarca   Chronic diastolic CHF (congestive heart failure) (HCC)   Hypokalemia   Right wrist drop   Discharge Medications: Allergies as of 08/29/2017      Reactions   Penicillins    Rash       Medication List    STOP taking these medications   hydrochlorothiazide 12.5 MG capsule Commonly known as:  MICROZIDE     TAKE these medications   aspirin 81 MG chewable tablet Chew by mouth daily.   enalapril 20 MG tablet Commonly known as:  VASOTEC Take 20 mg by mouth 2 (two) times daily.   furosemide 40 MG tablet Commonly known as:  LASIX Take 1 tablet (40 mg total) by mouth daily. What changed:  medication strength  how much to take   levothyroxine 75 MCG tablet Commonly known as:  SYNTHROID, LEVOTHROID Take 75 mcg by mouth daily before breakfast.   meloxicam 15 MG tablet Commonly known as:  MOBIC Take 15 mg by mouth daily.   metoprolol succinate 25 MG 24 hr tablet Commonly known as:  TOPROL-XL Take 25 mg by mouth daily.   oxybutynin 5 MG tablet Commonly known as:  DITROPAN Take 5 mg by mouth daily.   rosuvastatin 10 MG tablet Commonly known as:  CRESTOR Take 10 mg by mouth daily.            Discharge Care Instructions        Start     Ordered   08/29/17 0000  furosemide (LASIX) 40 MG tablet  Daily     08/29/17 1021   08/29/17 0000  Increase activity slowly     08/29/17 1021   08/29/17 0000  Diet - low sodium heart healthy     08/29/17 1021   08/29/17 0000  (HEART FAILURE PATIENTS) Call MD:  Anytime you have any of the following symptoms: 1) 3  pound weight gain in 24 hours or 5 pounds in 1 week 2) shortness of breath, with or without a dry hacking cough 3) swelling in the hands, feet or stomach 4) if you have to sleep on extra pillows at night in order to breathe.     08/29/17 1021      Disposition and follow-up:   Ms.Traci Meyer was [facility] in Good condition.  At the hospital follow up visit please address:  1.  Lower extremity swelling, furosemide dosage, potassium level  2.  Labs / imaging needed at time of follow-up: BMET, Magnesium, CBC  3.  Pending labs/ test needing follow-up: None  Follow-up Appointments:   Hospital Course by problem list: Principal Problem:   Acute on chronic diastolic heart failure (HCC) Active Problems:   Hypertension   Aortic atherosclerosis (HCC)   Anasarca   Chronic diastolic CHF (congestive heart failure) (HCC)   Hypokalemia   Right wrist drop   1. Acute on Chronic HFpEF Ms. Traci Meyer was admitted to [facility] Internal Medicine Teaching Service for acute on chronic HFpEF with lower extremity edema and a 15 pound weight gain since previous admission. She was started on IV  lasix and diuresed well. She lost a total of 15 pounds and had significant improvement in her lower extremity swelling. At the time of discharge the patient had returned to her previous weight and denied shortness of breath. She maintained that she was rather week and fatigued easily which is consistent with the PT/OT evaluations and recommendation for SNF placement. This is most likley secondary to her numerous hospitalized days. Patient is to continue her furosemide at 40mg  in the morning and to stop taking her HCTZ as previously advised.   2. Right Wrist Drop Ms. Traci Meyer also had right wrist and hand weakness on presentation that occurred three days prior to admission. An MRI of the brain was obtained to rule out CVA. The MRI showed no acute or subacute changes which  could be associated with her right sided symptoms. MRI indicated chronic white matter changes rather advanced for age which are most likely sequela of microvascular ischemia. Will continue to use wrist brace as patient states that has helped significantly. This is most likely an injury secondary to mild temporary nerve impingement with prolonged sequela. If resolution fails, consider additional evaluation.   3. Hypokalemia The patient's serum potassium was 3.4 on admission. She was repleted accordingly and her serum potassium returned to normal. She was at 4.0 on the day of discharge, see labs below. Please continue to monitor at her next outpatient visit and treat as necessary.   4. Hypertension, HLD The patient's blood pressure was stable throughout hospitalization. She was treated with her home medications: enalapril and metoprolol which permitted excellent control of her BP. Hydrochlorothiazide was discontinued in favor of furosemide.  Discharge Vitals:   BP 126/69   Pulse 81   Temp 98.8 F (37.1 C) (Oral)   Resp 16   Ht 5\' 2"  (1.575 m)   Wt 192 lb 9.6 oz (87.4 kg)   SpO2 94%   BMI 35.23 kg/m   Pertinent Labs, Studies, and Procedures:  CBC Latest Ref Rng & Units 08/28/2017 08/26/2017 08/25/2017  WBC 4.0 - 10.5 K/uL 5.2 5.3 8.8  Hemoglobin 12.0 - 15.0 g/dL 10.0(L) 8.8(L) 8.8(L)  Hematocrit 36.0 - 46.0 % 31.0(L) 27.3(L) 27.0(L)  Platelets 150 - 400 K/uL 371 330 364   BMP Latest Ref Rng & Units 08/29/2017 08/28/2017 08/27/2017  Glucose 65 - 99 mg/dL 161(W) 98 92  BUN 6 - 20 mg/dL 13 10 7   Creatinine 0.44 - 1.00 mg/dL 9.60 4.54 0.98  Sodium 135 - 145 mmol/L 139 139 139  Potassium 3.5 - 5.1 mmol/L 4.0 3.8 3.5  Chloride 101 - 111 mmol/L 100(L) 98(L) 100(L)  CO2 22 - 32 mmol/L 29 33(H) 31  Calcium 8.9 - 10.3 mg/dL 9.5 9.3 9.3    Discharge Instructions: Discharge Instructions    (HEART FAILURE PATIENTS) Call MD:  Anytime you have any of the following symptoms: 1) 3 pound weight gain in 24  hours or 5 pounds in 1 week 2) shortness of breath, with or without a dry hacking cough 3) swelling in the hands, feet or stomach 4) if you have to sleep on extra pillows at night in order to breathe.    Complete by:  As directed    Diet - low sodium heart healthy    Complete by:  As directed    Increase activity slowly    Complete by:  As directed       Signed: Lanelle Bal, MD 08/29/2017, 12:03 PM   Pager: [phone]

## 2017-08-27 NOTE — Progress Notes (Signed)
Patient will require 3 night inpatient stay to qualify for Medicare SNF placement. If patient is able to discharge Sunday or after, patient is able to discharge to [facility].  Osborne Cascoadia Giles Currie LCSWA [phone]

## 2017-08-28 ENCOUNTER — Inpatient Hospital Stay (HOSPITAL_COMMUNITY): Payer: Medicare Other

## 2017-08-28 DIAGNOSIS — I5033 Acute on chronic diastolic (congestive) heart failure: Secondary | ICD-10-CM

## 2017-08-28 DIAGNOSIS — E876 Hypokalemia: Secondary | ICD-10-CM

## 2017-08-28 DIAGNOSIS — E039 Hypothyroidism, unspecified: Secondary | ICD-10-CM

## 2017-08-28 LAB — CBC
HEMATOCRIT: 31 % — AB (ref 36.0–46.0)
Hemoglobin: 10 g/dL — ABNORMAL LOW (ref 12.0–15.0)
MCH: 29.9 pg (ref 26.0–34.0)
MCHC: 32.3 g/dL (ref 30.0–36.0)
MCV: 92.5 fL (ref 78.0–100.0)
PLATELETS: 371 10*3/uL (ref 150–400)
RBC: 3.35 MIL/uL — ABNORMAL LOW (ref 3.87–5.11)
RDW: 16.3 % — AB (ref 11.5–15.5)
WBC: 5.2 10*3/uL (ref 4.0–10.5)

## 2017-08-28 LAB — BASIC METABOLIC PANEL
Anion gap: 8 (ref 5–15)
BUN: 10 mg/dL (ref 6–20)
CALCIUM: 9.3 mg/dL (ref 8.9–10.3)
CO2: 33 mmol/L — AB (ref 22–32)
CREATININE: 0.64 mg/dL (ref 0.44–1.00)
Chloride: 98 mmol/L — ABNORMAL LOW (ref 101–111)
GFR calc Af Amer: >60 mL/min (ref >60)
GLUCOSE: 98 mg/dL (ref 65–99)
Potassium: 3.8 mmol/L (ref 3.5–5.1)
Sodium: 139 mmol/L (ref 135–145)

## 2017-08-28 LAB — MAGNESIUM: Magnesium: 2 mg/dL (ref 1.7–2.4)

## 2017-08-28 LAB — FERRITIN: Ferritin: 40 ng/mL (ref 11–307)

## 2017-08-28 MED ORDER — DICLOFENAC SODIUM 1 % TD GEL
4.0000 g | Freq: Four times a day (QID) | TRANSDERMAL | Status: DC
  Administered 2017-08-28 – 2017-08-29 (×3): 4 g via TOPICAL
  Filled 2017-08-28: qty 100

## 2017-08-28 MED ORDER — ACETAMINOPHEN 325 MG PO TABS: 650.0000 mg | ORAL_TABLET | Freq: Four times a day (QID) | ORAL | Status: DC | PRN

## 2017-08-28 NOTE — Progress Notes (Signed)
Occupational Therapy Treatment Patient Details Name: Traci DivineDorothy P File MRN: [id number] DOB: August 22, 1931 Today's Date: 08/28/2017    History of present illness pt is an 83 y/o female with pmh significant for HTN, degenerative arthrities, HFpEF and LE edema, presenting via [facility] ED for worsening LE edema and decreasing ability to transfer well.   OT comments  Pt progressing toward OT goals and demonstrates understanding of R UE positioning this session following education with min cueing. On OT arrival, pt wearing R wrist cock-up splint with poor fit and wrist remaining in flexed position. She reports that she has had it on since the previous evening and OT noted no signs of skin breakdown. Adjusted splint positioning to minimize overstretch of R wrist extensors and maximize functional positioning of R wrist for functional activities. Pt continues to have peripheral IV in dorsum of hand with concern for skin breakdown and poor fit of splint and discussed need to move this with RN. Once IV is moved, will establish wearing schedule. OT will continue to follow.   Follow Up Recommendations  SNF;Supervision/Assistance - 24 hour    Equipment Recommendations  Other (comment) (TBD at next venue of care)    Recommendations for Other Services      Precautions / Restrictions Precautions Precautions: Fall Restrictions Weight Bearing Restrictions: No       Mobility Bed Mobility                  Transfers                      Balance                                           ADL either performed or assessed with clinical judgement   ADL Overall ADL's : Needs assistance/impaired Eating/Feeding: Minimal assistance Eating/Feeding Details (indicate cue type and reason): Pt states she is unable to use her R hand at this time to feed self. May be able to feed self with splint and tubing                                   General ADL Comments: Pt seen  for splint fitting. R velcro wrist cock-up splint on wrist on OT arrival with poor fit and wrist remaining in flexed position. OT adjusted fit to provide maximum support. Pt continues to have IV in dorsum of R hand and discussed with RN need to change position and he reports that they will take a look at this.      Vision       Perception     Praxis      Cognition Arousal/Alertness: Awake/alert Behavior During Therapy: WFL for tasks assessed/performed Overall Cognitive Status: Within Functional Limits for tasks assessed                                          Exercises General Exercises - Upper Extremity Shoulder Flexion: AAROM;Right;5 reps (Limited ROM to 0-45 degrees.) Elbow Flexion: AROM;Right;10 reps Wrist Extension: AAROM;Right;10 reps Digit Composite Flexion: AAROM;Right;10 reps Composite Extension: AAROM;Right;10 reps   Shoulder Instructions       General Comments      Pertinent Vitals/ Pain  Pain Assessment: Faces Faces Pain Scale: Hurts little more Pain Location: R shoulder  Home Living                                          Prior Functioning/Environment              Frequency  Min 2X/week        Progress Toward Goals  OT Goals(current goals can now be found in the care plan section)  Progress towards OT goals: Progressing toward goals  Acute Rehab OT Goals Patient Stated Goal: pt wanting to get back home after rehab. OT Goal Formulation: With patient Time For Goal Achievement: 09/09/17 Potential to Achieve Goals: Fair  Plan Discharge plan remains appropriate    Co-evaluation                 AM-PAC PT "6 Clicks" Daily Activity     Outcome Measure   Help from another person eating meals?: A Little Help from another person taking care of personal grooming?: A Little Help from another person toileting, which includes using toliet, bedpan, or urinal?: A Lot Help from another person bathing  (including washing, rinsing, drying)?: A Lot Help from another person to put on and taking off regular upper body clothing?: A Lot Help from another person to put on and taking off regular lower body clothing?: Total 6 Click Score: 13    End of Session Equipment Utilized During Treatment:  (R wrist cock-up splint)  OT Visit Diagnosis: Other abnormalities of gait and mobility (R26.89);Muscle weakness (generalized) (M62.81)   Activity Tolerance Patient tolerated treatment well   Patient Left in bed;with call bell/phone within reach;with bed alarm set   Nurse Communication  (Need to move IV for wrist cock-up splint.)        Time: 1610-9604 OT Time Calculation (min): 20 min  Charges: OT General Charges $OT Visit: 1 Visit OT Treatments $Orthotics Fit/Training: 8-22 mins  Doristine Section, MS OTR/L  Pager: [phone]    Kimeka Badour A Tarsha Blando 08/28/2017, 8:15 AM

## 2017-08-28 NOTE — Progress Notes (Signed)
   Subjective: Patient was feeling better and seen today. According to patient wrist splint is really helping her, she has noticed some improvement in her strength. She has no other complaints today.  Objective:  Vital signs in last 24 hours: Vitals:   08/27/17 1422 08/27/17 2125 08/28/17 0514 08/28/17 0841  BP: 101/62 104/61 (!) 120/58 121/72  Pulse: 91 81 72 83  Resp: 16 17 16    Temp: 98.4 F (36.9 C) 98.9 F (37.2 C) 98.2 F (36.8 C)   TempSrc: Oral Oral Oral   SpO2: 98% 97% 94%   Weight:      Height:       Gen. Well-developed elderly lady, sitting comfortably in her bed, in no acute distress. Lungs.clear bilaterally. CV.regular rate and rhythm. Abdomen.soft, non tender, bowel sounds positive. Extremities.trace pedal edema,pulses intact and symmetrical.  Assessment/Plan:  Acute on Chronic HFpEF exacerbation.patient appears euvolemic today, her weight today was 201 pounds, with output of -800. Her dry weight is around 195-196 pounds. -Continue IV Lasix 40 mg daily for 1 more day. -Continue Enalapril 20 mg BID - Continue Toprol XL 25 mg po qd - Monitor volume status: I/Os, daily weights. -She will be discharged tomorrow to skilled nursing facility according to PT/OT recommendations.  Right wrist and hand weakness. Mild improvement with wrist splinting. Most likely due to radial and ulnar nerve deficit. MRI negative for any stroke. -continue PT.  Hypokalemia: Resolved with normal magnesium at 2.2 today. -repeat BMP in the morning as patient is still on IV Lasix.  Hypothyroidism: Continue home Synthroid 75 mcg daily   Dispo: Anticipated discharge in approximately 1 day(s).   Arnetha CourserAmin, Erminie Foulks, MD 08/28/2017, 1:18 PM Pager: [phone]

## 2017-08-29 LAB — BASIC METABOLIC PANEL
Anion gap: 10 (ref 5–15)
BUN: 13 mg/dL (ref 6–20)
CALCIUM: 9.5 mg/dL (ref 8.9–10.3)
CHLORIDE: 100 mmol/L — AB (ref 101–111)
CO2: 29 mmol/L (ref 22–32)
CREATININE: 0.56 mg/dL (ref 0.44–1.00)
GFR calc non Af Amer: >60 mL/min (ref >60)
Glucose, Bld: 100 mg/dL — ABNORMAL HIGH (ref 65–99)
Potassium: 4 mmol/L (ref 3.5–5.1)
SODIUM: 139 mmol/L (ref 135–145)

## 2017-08-29 MED ORDER — FUROSEMIDE 40 MG PO TABS
40.0000 mg | ORAL_TABLET | Freq: Every day | ORAL | Status: DC
  Administered 2017-08-29: 40 mg via ORAL
  Filled 2017-08-29: qty 1

## 2017-08-29 MED ORDER — FUROSEMIDE 40 MG PO TABS: 40.0000 mg | ORAL_TABLET | Freq: Every day | ORAL | 1 refills | Status: DC

## 2017-08-29 MED ORDER — FUROSEMIDE 40 MG PO TABS: 40.0000 mg | ORAL_TABLET | Freq: Every day | ORAL | Status: DC

## 2017-08-29 MED ORDER — FUROSEMIDE 80 MG PO TABS: 80.0000 mg | ORAL_TABLET | Freq: Every day | ORAL | Status: DC

## 2017-08-29 NOTE — Clinical Social Work Placement (Signed)
   CLINICAL SOCIAL WORK PLACEMENT  NOTE  Date:  08/29/2017  Patient Details  Name: Traci Meyer MRN: [id number] Date of Birth: May 08, 1931  Clinical Social Work is seeking post-discharge placement for this patient at the Skilled  Nursing Facility level of care (*CSW will initial, date and re-position this form in  chart as items are completed):  Yes   Patient/family provided with [place] Clinical Social Work Department's list of facilities offering this level of care within the geographic area requested by the patient (or if unable, by the patient's family).  Yes   Patient/family informed of their freedom to choose among providers that offer the needed level of care, that participate in Medicare, Medicaid or managed care program needed by the patient, have an available bed and are willing to accept the patient.  Yes   Patient/family informed of [place]'s ownership interest in [facility] and [facility], as well as of the fact that they are under no obligation to receive care at these facilities.  PASRR submitted to EDS on 08/26/17     PASRR number received on 08/26/17     Existing PASRR number confirmed on       FL2 transmitted to all facilities in geographic area requested by pt/family on 08/26/17     FL2 transmitted to all facilities within larger geographic area on       Patient informed that his/her managed care company has contracts with or will negotiate with certain facilities, including the following:        Yes   Patient/family informed of bed offers received.  Patient [facility] at  [facility]     Physician recommends and patient chooses bed at      Patient to be transferred to  [facility] on 08/29/17.  Patient to be transferred to facility by  [facility])     Patient family notified on 08/29/17 of transfer.  Name of family member notified:  Dtr - Terri     PHYSICIAN       Additional Comment:    _______________________________________________ Norlene DuelBROWN,  [name], LCSWA 08/29/2017, 2:14 PM

## 2017-08-29 NOTE — Progress Notes (Signed)
   Subjective: Patient was resting comfortably upon entering the room. She stated that she did not have any concerns today and other than desiring to know the name of the facility she was being discharged to, she denied questions. Patient was comfortable and denied SOB.  Objective:  Vital signs in last 24 hours: Vitals:   08/28/17 1524 08/28/17 2109 08/28/17 2111 08/29/17 0513  BP: (!) 100/55 105/63 105/63 (!) 98/48  Pulse: 89  84 60  Resp: 17  16 16   Temp: 98.6 F (37 C)  98.6 F (37 C) 98.8 F (37.1 C)  TempSrc: Oral  Oral Oral  SpO2: 96%  92% 94%  Weight:    192 lb 9.6 oz (87.4 kg)  Height:       Review of Systems  Constitutional: Negative for diaphoresis and fever.  Eyes: Negative for blurred vision.  Respiratory: Negative for cough and shortness of breath.   Cardiovascular: Positive for leg swelling (Improving). Negative for chest pain.  Gastrointestinal: Negative for nausea and vomiting.  Genitourinary: Negative for urgency.  Neurological: Negative for dizziness and headaches.   Physical Exam  Constitutional: She is oriented to person, place, and time. She appears well-developed and well-nourished. No distress.  HENT:  Head: Normocephalic and atraumatic.  Eyes: EOM are normal.  Neck: Normal range of motion.  Cardiovascular: Normal rate, regular rhythm and intact distal pulses.   Pulmonary/Chest: Effort normal. No stridor. No respiratory distress.  Abdominal: Soft. Bowel sounds are normal. She exhibits no distension. There is no tenderness.  Musculoskeletal: She exhibits edema (Mild non pitting). She exhibits no tenderness.  Neurological: She is alert and oriented to person, place, and time.  Skin: Skin is warm. Capillary refill takes less than 2 seconds. She is not diaphoretic.  Psychiatric: She has a normal mood and affect. Her behavior is normal.    Assessment/Plan:  Principal Problem:   Acute on chronic diastolic heart failure (HCC) Active Problems:  Hypertension   Aortic atherosclerosis (HCC)   Anasarca   Chronic diastolic CHF (congestive heart failure) (HCC)   Hypokalemia   Right wrist drop  Acute on chronic diastolic heart failure: -weight down to 202 most recent, will recheck today -furosemide not given yesterday? Will confirm -will continue furosemide 80mg  PO today and discharge home on home dose -PT/OT evaluation -Continue Enalapril 20mg  BID -Continue Toprol XL 25mg  PO qd -Plan to discharge to SNF today  Right wrist and hand weakness: Focal neurological deficit consistent w/ radial and ulnar nerve. Hyporeflexia more consistent w/ lower motor neuron/peripheral deficit, but pt has significant risk factors for CVA including HTN and HLD.  -MRI brain did not demonstrate acute or subacute process failing to demonstrate the cause of the patients right weakness. Chronic atrophy consistent with white matter changes of advanced age likely secondary to microvascular ischemia noted.  Hypokalemia: -Repeat BMP in AM,   Hypothyroidism: -Continue home synthroid 75mcg daily  Diet: Heart healthy IV Fluids: None Code: Do not intubate-all else acceptable Dispo: Anticipated discharge in approximately 0 days.   Lanelle BalHarbrecht, Myriam Brandhorst, MD 08/29/2017, 10:34 AM Pager: Pager# [phone]

## 2017-08-29 NOTE — Progress Notes (Signed)
Occupational Therapy Treatment Patient Details Name: Traci Meyer MRN: [id number] DOB: 03-09-1931 Today's Date: 08/29/2017    History of present illness pt is an 83 y/o female with pmh significant for HTN, degenerative arthrities, HFpEF and LE edema, presenting via [facility] ED for worsening LE edema and decreasing ability to transfer well.   OT comments  Pt demonstrating improving R wrist functional use demonstrating 2-/5 strength this session. Pt tolerated R wrist cock-up splint wear for 1.5 hours with no signs of redness or pressure areas. Educated pt and RN concerning wear schedule (4 hours on, 2 hours off to provide pressure relief during the day and splint wear throughout the night) with handouts provided. Pt able to don/doff splint with min assist and was able to verbalize wear schedule after delayed recall. OT will continue to follow while admitted.    Follow Up Recommendations  SNF;Supervision/Assistance - 24 hour    Equipment Recommendations  Other (comment) (TBD at next venue of care)    Recommendations for Other Services      Precautions / Restrictions Precautions Precautions: Fall Restrictions Weight Bearing Restrictions: No       Mobility Bed Mobility                  Transfers                      Balance                                           ADL either performed or assessed with clinical judgement   ADL Overall ADL's : Needs assistance/impaired   Eating/Feeding Details (indicate cue type and reason): Pt states she is unable to use her R hand at this time to feed self. May be able to feed self with splint and tubing                                   General ADL Comments: Session focused on splint check and AAROM of R wirst. Educated pt concerning splint care, splint wear schedule, and method to don/doff splint. Handout provided. Pt tolerated splint wear for 1.5 hours with no signs of pressure areas and no  discomfort.      Vision       Perception     Praxis      Cognition Arousal/Alertness: Awake/alert Behavior During Therapy: WFL for tasks assessed/performed Overall Cognitive Status: Within Functional Limits for tasks assessed                                          Exercises General Exercises - Upper Extremity Elbow Flexion: AROM;Right;10 reps Elbow Extension: AROM;Right;10 reps Wrist Extension: AAROM;Right;10 reps (Increased strength and AROM to demonstrate 2-/5 strength. ) Digit Composite Flexion: AAROM;Right;10 reps Composite Extension: AAROM;Right;10 reps   Shoulder Instructions       General Comments      Pertinent Vitals/ Pain       Pain Assessment: No/denies pain  Home Living  Prior Functioning/Environment              Frequency  Min 2X/week        Progress Toward Goals  OT Goals(current goals can now be found in the care plan Meyer)  Progress towards OT goals: Progressing toward goals  Acute Rehab OT Goals Patient Stated Goal: pt wanting to get back home after rehab. OT Goal Formulation: With patient Time For Goal Achievement: 09/09/17 Potential to Achieve Goals: Fair  Plan Discharge plan remains appropriate    Co-evaluation                 AM-PAC PT "6 Clicks" Daily Activity     Outcome Measure   Help from another person eating meals?: A Little Help from another person taking care of personal grooming?: A Little Help from another person toileting, which includes using toliet, bedpan, or urinal?: A Lot Help from another person bathing (including washing, rinsing, drying)?: A Lot Help from another person to put on and taking off regular upper body clothing?: A Lot Help from another person to put on and taking off regular lower body clothing?: Total 6 Click Score: 13    End of Session Equipment Utilized During Treatment:  (R wrist cock-up  splint)  OT Visit Diagnosis: Other abnormalities of gait and mobility (R26.89);Muscle weakness (generalized) (M62.81)   Activity Tolerance Patient tolerated treatment well   Patient Left in bed;with call bell/phone within reach;with bed alarm set   Nurse Communication  (Splint wear schedule)        Time: 1610-9604 OT Time Calculation (min): 45 min  Charges: OT General Charges $OT Visit: 1 Visit OT Treatments $Therapeutic Activity: 8-22 mins $Therapeutic Exercise: 8-22 mins $Orthotics/Prosthetics Check: 8-22 mins  Traci Section, MS OTR/L  Pager: [phone]    Traci Meyer 08/29/2017, 9:53 AM

## 2017-08-29 NOTE — Progress Notes (Signed)
OT NOTE  RN STAFF  Please check splint every 4 hours during shift ( remove splint ) to assess for: * pain * redness *swelling *skin break down  If any symptoms above are present remove splint for 15 minutes. If symptoms continue - keep the splint removed and notify OT staff [phone] immediately.    Splint can be cleaned with warm soapy water . Splint should not come in contact with any type of heat because the splint will mold into a new shape.   Splints are to be worn for 4 hours and off for 2 hours  Examples of schedule:  Splints off at 6am Splints on at 8 am Splints off at 12 pm Splints on at 2 pm Splints off at 6pm Splints on at 8 pm   Traci Sectionharity A Cyenna Rebello, MS OTR/L  Pager: [phone]

## 2017-08-29 NOTE — Clinical Social Work Note (Signed)
Clinical Social Worker facilitated patient discharge including contacting patient family (Dtr Terri) and facility [facility]) to confirm patient discharge plans. Clinical information faxed to facility and family agreeable with plan. CSW arranged ambulance transport via [facility] to [facility]. RN to call report prior to discharge.  Clinical Social Worker will sign off for now as social work intervention is no longer needed. Please consult us again if new need arises.  Dillen Belmontes B. Gean QuintBrown,MSW, LCSWA Clinical Social Work Dept [employer] Social Worker [phone] 2:11 PM

## 2018-01-20 ENCOUNTER — Emergency Department (HOSPITAL_COMMUNITY): Payer: Medicare Other

## 2018-01-20 ENCOUNTER — Emergency Department (HOSPITAL_COMMUNITY)
Admission: EM | Admit: 2018-01-20 | Discharge: 2018-01-21 | Disposition: A | Discharge status: Home / Self Care | Payer: Medicare Other | Attending: Emergency Medicine | Admitting: Emergency Medicine

## 2018-01-20 ENCOUNTER — Encounter (HOSPITAL_COMMUNITY): Payer: Self-pay | Admitting: Pharmacy Technician

## 2018-01-20 ENCOUNTER — Other Ambulatory Visit: Payer: Self-pay

## 2018-01-20 DIAGNOSIS — Z87891 Personal history of nicotine dependence: Secondary | ICD-10-CM | POA: Diagnosis not present

## 2018-01-20 DIAGNOSIS — R600 Localized edema: Secondary | ICD-10-CM | POA: Diagnosis not present

## 2018-01-20 DIAGNOSIS — Z7982 Long term (current) use of aspirin: Secondary | ICD-10-CM | POA: Insufficient documentation

## 2018-01-20 DIAGNOSIS — Z79899 Other long term (current) drug therapy: Secondary | ICD-10-CM | POA: Diagnosis not present

## 2018-01-20 DIAGNOSIS — I5032 Chronic diastolic (congestive) heart failure: Secondary | ICD-10-CM | POA: Diagnosis not present

## 2018-01-20 DIAGNOSIS — I110 Hypertensive heart disease with heart failure: Secondary | ICD-10-CM | POA: Diagnosis not present

## 2018-01-20 DIAGNOSIS — R609 Edema, unspecified: Secondary | ICD-10-CM

## 2018-01-20 LAB — COMPREHENSIVE METABOLIC PANEL
ALBUMIN: 3.4 g/dL — AB (ref 3.5–5.0)
ALK PHOS: 77 U/L (ref 38–126)
ALT: 15 U/L (ref 14–54)
ANION GAP: 15 (ref 5–15)
AST: 35 U/L (ref 15–41)
BILIRUBIN TOTAL: 0.8 mg/dL (ref 0.3–1.2)
BUN: 18 mg/dL (ref 6–20)
CALCIUM: 9.3 mg/dL (ref 8.9–10.3)
CO2: 21 mmol/L — AB (ref 22–32)
Chloride: 104 mmol/L (ref 101–111)
Creatinine, Ser: 0.63 mg/dL (ref 0.44–1.00)
GFR calc non Af Amer: >60 mL/min (ref >60)
Glucose, Bld: 81 mg/dL (ref 65–99)
POTASSIUM: 3.4 mmol/L — AB (ref 3.5–5.1)
SODIUM: 140 mmol/L (ref 135–145)
TOTAL PROTEIN: 6.3 g/dL — AB (ref 6.5–8.1)

## 2018-01-20 LAB — CBC WITH DIFFERENTIAL/PLATELET
BASOS PCT: 1 %
Basophils Absolute: 0 10*3/uL (ref 0.0–0.1)
EOS ABS: 0.1 10*3/uL (ref 0.0–0.7)
Eosinophils Relative: 2 %
HEMATOCRIT: 28.1 % — AB (ref 36.0–46.0)
HEMOGLOBIN: 9.1 g/dL — AB (ref 12.0–15.0)
Lymphocytes Relative: 16 %
Lymphs Abs: 1.2 10*3/uL (ref 0.7–4.0)
MCH: 30.8 pg (ref 26.0–34.0)
MCHC: 32.4 g/dL (ref 30.0–36.0)
MCV: 95.3 fL (ref 78.0–100.0)
Monocytes Absolute: 0.6 10*3/uL (ref 0.1–1.0)
Monocytes Relative: 8 %
NEUTROS ABS: 5.3 10*3/uL (ref 1.7–7.7)
NEUTROS PCT: 73 %
Platelets: 345 10*3/uL (ref 150–400)
RBC: 2.95 MIL/uL — AB (ref 3.87–5.11)
RDW: 15.4 % (ref 11.5–15.5)
WBC: 7.2 10*3/uL (ref 4.0–10.5)

## 2018-01-20 LAB — I-STAT TROPONIN, ED: TROPONIN I, POC: 0 ng/mL (ref 0.00–0.08)

## 2018-01-20 LAB — BRAIN NATRIURETIC PEPTIDE: B Natriuretic Peptide: 18.3 pg/mL (ref 0.0–100.0)

## 2018-01-20 MED ORDER — FUROSEMIDE 20 MG PO TABS: 20.0000 mg | ORAL_TABLET | Freq: Every day | ORAL | 0 refills | Status: DC

## 2018-01-20 NOTE — ED Notes (Signed)
[facility] arrived to transport pt back home. 

## 2018-01-20 NOTE — Discharge Instructions (Signed)
1.  Take Lasix 40 mg daily (2 tablets) for the next 3 days.  Then continue with 20 mg (1 tablet) daily. 2.  Make an appointment to see your family doctor at the beginning of the week.

## 2018-01-20 NOTE — ED Notes (Signed)
Pt dressed and ready for transport 

## 2018-01-20 NOTE — ED Notes (Signed)
Patient transported to X-ray 

## 2018-01-20 NOTE — ED Triage Notes (Signed)
Pt arrives via ems from home with complaints of BLE worsening over the last month. Pt takes "fluid pill" with no recent medication changes. Per EMS pt unable to stand on her own X2-3 months due to the swelling. BP 140/88, HR 88, 18 RR.

## 2018-01-20 NOTE — ED Notes (Signed)
ED Provider at bedside. 

## 2018-01-20 NOTE — ED Notes (Signed)
[facility] CALLED FOR TRANSPORT. 

## 2018-01-20 NOTE — ED Notes (Signed)
Family at bedside. 

## 2018-01-20 NOTE — ED Notes (Signed)
Pt aware of need for urine sample. Pt will attempt to void.

## 2018-01-20 NOTE — ED Notes (Signed)
Patient resting with no distress/respirations unlabored , waiting for [facility] to transport back home .

## 2018-01-20 NOTE — ED Provider Notes (Signed)
[facility] Provider Note   CSN: [id number] Arrival date & time: 01/20/18  1417     History   Chief Complaint Chief Complaint  Patient presents with  . Leg Swelling    HPI Traci Meyer is a 84 y.o. female.  HPI Patient's daughter reports increased swelling of her legs for about a week.  She reports she does take morning Lasix but she cannot recall her dose.  Patient denies she is having any problems with shortness of breath or chest pain.  At baseline she is mostly bedbound but transfers into a wheelchair to move around the house.  Patient cannot really ambulate.  Patient denies any generalized symptoms of malaise or loss of appetite.  Patient's daughter reports aside from the swelling everything seems to be about at baseline. Past Medical History:  Diagnosis Date  . Aortic atherosclerosis (HCC)   . Arthritis   . Hypertension     Patient Active Problem List   Diagnosis Date Noted  . Right wrist drop 08/27/2017  . Anasarca 08/25/2017  . Chronic diastolic CHF (congestive heart failure) (HCC) 08/25/2017  . Hypokalemia 08/25/2017  . Chest pain 08/01/2017  . Acute on chronic diastolic heart failure (HCC) 08/01/2017  . Arthritis 08/01/2017  . Hypertension 08/01/2017  . Aortic atherosclerosis (HCC) 08/01/2017    Past Surgical History:  Procedure Laterality Date  . MULTIPLE TOOTH EXTRACTIONS      OB History    No data available       Home Medications    Prior to Admission medications   Medication Sig Start Date End Date Taking? Authorizing Provider  aspirin 81 MG chewable tablet Chew by mouth daily.   Yes [provider]  enalapril (VASOTEC) 20 MG tablet Take 20 mg by mouth 2 (two) times daily.   Yes [provider]  furosemide (LASIX) 20 MG tablet Take 20 mg by mouth daily.   Yes [provider]  levothyroxine (SYNTHROID, LEVOTHROID) 75 MCG tablet Take 75 mcg by mouth daily before breakfast.    Yes [provider]  meloxicam (MOBIC) 15 MG tablet Take 15 mg by mouth daily.   Yes [provider]  metoprolol succinate (TOPROL-XL) 25 MG 24 hr tablet Take 25 mg by mouth daily.   Yes [provider]  oxybutynin (DITROPAN) 5 MG tablet Take 5 mg by mouth daily.   Yes [provider]  rosuvastatin (CRESTOR) 10 MG tablet Take 10 mg by mouth daily.   Yes [provider]  furosemide (LASIX) 20 MG tablet Take 1 tablet (20 mg total) by mouth daily. 01/20/18   Arby BarrettePfeiffer, Jiovanny Burdell, MD  furosemide (LASIX) 40 MG tablet Take 1 tablet (40 mg total) by mouth daily. Patient not taking: Reported on 01/20/2018 08/29/17   Lanelle BalHarbrecht, Lawrence, MD    Family History Family History  Problem Relation Age of Onset  . Heart disease Neg Hx     Social History Social History   Tobacco Use  . Smoking status: Former Games developermoker  . Smokeless tobacco: Never Used  . Tobacco comment: 45 years ago briefly  Substance Use Topics  . Alcohol use: No  . Drug use: No     Allergies   Penicillins   Review of Systems Review of Systems 10 Systems reviewed and are negative for acute change except as noted in the HPI.   Physical Exam Updated Vital Signs BP (!) 121/44   Pulse 82   Temp 98.9 F (37.2 C) (Oral)  Resp 18   Ht 5\' 3"  (1.6 m)   SpO2 100%   BMI 34.12 kg/m   Physical Exam  Constitutional:  Patient is deconditioned.  She has obesity.  She is nontoxic.  No respiratory distress at rest.  HENT:  Head: Normocephalic and atraumatic.  Mouth/Throat: Oropharynx is clear and moist.  Eyes: EOM are normal.  Neck: Neck supple.  Cardiovascular: Normal rate, regular rhythm, normal heart sounds and intact distal pulses.  Pulmonary/Chest: Effort normal and breath sounds normal.  Abdominal: Soft. She exhibits no distension. There is no tenderness. There is no guarding.  Musculoskeletal: She exhibits edema. She exhibits no tenderness or deformity.  Neurological: She is alert.  She exhibits normal muscle tone. Coordination normal.  At baseline patient is nonambulatory.  She is interactive.  Her speech is clear.  She is situationally oriented.  She is very physically deconditioned and cannot do much to reposition or move herself in the stretcher.  Skin: Skin is warm and dry.  Psychiatric: She has a normal mood and affect.           ED Treatments / Results  Labs (all labs ordered are listed, but only abnormal results are displayed) Labs Reviewed  COMPREHENSIVE METABOLIC PANEL - Abnormal; Notable for the following components:      Result Value   Potassium 3.4 (*)    CO2 21 (*)    Total Protein 6.3 (*)    Albumin 3.4 (*)    All other components within normal limits  CBC WITH DIFFERENTIAL/PLATELET - Abnormal; Notable for the following components:   RBC 2.95 (*)    Hemoglobin 9.1 (*)    HCT 28.1 (*)    All other components within normal limits  BRAIN NATRIURETIC PEPTIDE  I-STAT TROPONIN, ED    EKG  EKG Interpretation  Date/Time:  Thursday January 20 2018 16:36:59 EST Ventricular Rate:  84 PR Interval:    QRS Duration: 97 QT Interval:  378 QTC Calculation: 447 R Axis:   -12 Text Interpretation:  Sinus rhythm Prolonged PR interval Probable left ventricular hypertrophy Inferior infarct, old no sig change from previous Confirmed by Arby Barrette [id number]) on 01/20/2018 5:14:07 PM       Radiology Dg Chest 2 View  Result Date: 01/20/2018 CLINICAL DATA:  Bilateral lower extremity edema EXAM: CHEST  2 VIEW COMPARISON:  08/25/2017 FINDINGS: Cardiac shadow remains enlarged. Patient is somewhat rotated to the right accentuating the mediastinal markings. No focal infiltrate or sizable effusion is seen. No acute bony abnormality is noted. IMPRESSION: No active cardiopulmonary disease. Electronically Signed   By: Alcide Clever M.D.   On: 01/20/2018 17:29    Procedures Procedures (including critical care time)  Medications Ordered in ED Medications - No  data to display   Initial Impression / Assessment and Plan / ED Course  I have reviewed the triage vital signs and the nursing notes.  Pertinent labs & imaging results that were available during my care of the patient were reviewed by me and considered in my medical decision making (see chart for details).      Final Clinical Impressions(s) / ED Diagnoses   Final diagnoses:  Peripheral edema  Patient has no respiratory distress or chest pain.  Chest x-ray is clear, BNP is not elevated.  At this time have low suspicion for CHF as etiology for increased lower extremity swelling.  Patient reports chronic swelling.  At this time appears most consistent with venous stasis.  Patient has some chronic skin changes  but does not appear to have acute cellulitis and no open wounds.  Plan will be to increase her Lasix dose to 40 mg for the next 3 days then resume 20 mg daily.  Management for venous stasis is recommended.  Patient should have close follow-up with her PCP.  ED Discharge Orders        Ordered    furosemide (LASIX) 20 MG tablet  Daily     01/20/18 2022       Arby Barrette, MD 01/20/18 2140

## 2018-02-03 ENCOUNTER — Encounter (HOSPITAL_COMMUNITY): Payer: Self-pay

## 2018-02-03 ENCOUNTER — Emergency Department (HOSPITAL_COMMUNITY)
Admission: EM | Admit: 2018-02-03 | Discharge: 2018-02-03 | Disposition: A | Discharge status: Home / Self Care | Payer: Medicare Other | Attending: Emergency Medicine | Admitting: Emergency Medicine

## 2018-02-03 ENCOUNTER — Emergency Department (HOSPITAL_COMMUNITY): Payer: Medicare Other

## 2018-02-03 ENCOUNTER — Other Ambulatory Visit: Payer: Self-pay

## 2018-02-03 DIAGNOSIS — Y9389 Activity, other specified: Secondary | ICD-10-CM | POA: Diagnosis not present

## 2018-02-03 DIAGNOSIS — W050XXA Fall from non-moving wheelchair, initial encounter: Secondary | ICD-10-CM | POA: Diagnosis not present

## 2018-02-03 DIAGNOSIS — Y92009 Unspecified place in unspecified non-institutional (private) residence as the place of occurrence of the external cause: Secondary | ICD-10-CM | POA: Diagnosis not present

## 2018-02-03 DIAGNOSIS — Y998 Other external cause status: Secondary | ICD-10-CM | POA: Diagnosis not present

## 2018-02-03 DIAGNOSIS — I5032 Chronic diastolic (congestive) heart failure: Secondary | ICD-10-CM | POA: Insufficient documentation

## 2018-02-03 DIAGNOSIS — I110 Hypertensive heart disease with heart failure: Secondary | ICD-10-CM | POA: Diagnosis not present

## 2018-02-03 DIAGNOSIS — W19XXXA Unspecified fall, initial encounter: Secondary | ICD-10-CM

## 2018-02-03 DIAGNOSIS — S72491A Other fracture of lower end of right femur, initial encounter for closed fracture: Secondary | ICD-10-CM | POA: Diagnosis not present

## 2018-02-03 DIAGNOSIS — I2510 Atherosclerotic heart disease of native coronary artery without angina pectoris: Secondary | ICD-10-CM | POA: Insufficient documentation

## 2018-02-03 DIAGNOSIS — Z87891 Personal history of nicotine dependence: Secondary | ICD-10-CM | POA: Insufficient documentation

## 2018-02-03 DIAGNOSIS — Z79899 Other long term (current) drug therapy: Secondary | ICD-10-CM | POA: Insufficient documentation

## 2018-02-03 DIAGNOSIS — S8991XA Unspecified injury of right lower leg, initial encounter: Secondary | ICD-10-CM | POA: Diagnosis present

## 2018-02-03 DIAGNOSIS — S72409A Unspecified fracture of lower end of unspecified femur, initial encounter for closed fracture: Secondary | ICD-10-CM

## 2018-02-03 DIAGNOSIS — Z7982 Long term (current) use of aspirin: Secondary | ICD-10-CM | POA: Insufficient documentation

## 2018-02-03 LAB — CBC WITH DIFFERENTIAL/PLATELET
Basophils Absolute: 0 10*3/uL (ref 0.0–0.1)
Basophils Relative: 0 %
EOS PCT: 0 %
Eosinophils Absolute: 0 10*3/uL (ref 0.0–0.7)
HCT: 28.9 % — ABNORMAL LOW (ref 36.0–46.0)
Hemoglobin: 9.8 g/dL — ABNORMAL LOW (ref 12.0–15.0)
LYMPHS ABS: 0.7 10*3/uL (ref 0.7–4.0)
LYMPHS PCT: 7 %
MCH: 31.1 pg (ref 26.0–34.0)
MCHC: 33.9 g/dL (ref 30.0–36.0)
MCV: 91.7 fL (ref 78.0–100.0)
MONO ABS: 0.7 10*3/uL (ref 0.1–1.0)
Monocytes Relative: 7 %
Neutro Abs: 8.9 10*3/uL — ABNORMAL HIGH (ref 1.7–7.7)
Neutrophils Relative %: 86 %
PLATELETS: 447 10*3/uL — AB (ref 150–400)
RBC: 3.15 MIL/uL — ABNORMAL LOW (ref 3.87–5.11)
RDW: 15.8 % — AB (ref 11.5–15.5)
WBC: 10.3 10*3/uL (ref 4.0–10.5)

## 2018-02-03 LAB — URINALYSIS, ROUTINE W REFLEX MICROSCOPIC
Bilirubin Urine: NEGATIVE
GLUCOSE, UA: NEGATIVE mg/dL
Ketones, ur: 20 mg/dL — AB
Leukocytes, UA: NEGATIVE
Nitrite: NEGATIVE
PH: 5 (ref 5.0–8.0)
PROTEIN: NEGATIVE mg/dL
Specific Gravity, Urine: 1.021 (ref 1.005–1.030)

## 2018-02-03 LAB — BASIC METABOLIC PANEL
Anion gap: 8 (ref 5–15)
BUN: 17 mg/dL (ref 6–20)
CALCIUM: 9.4 mg/dL (ref 8.9–10.3)
CO2: 25 mmol/L (ref 22–32)
Chloride: 106 mmol/L (ref 101–111)
Creatinine, Ser: 0.55 mg/dL (ref 0.44–1.00)
GFR calc Af Amer: >60 mL/min (ref >60)
GLUCOSE: 89 mg/dL (ref 65–99)
Potassium: 3.2 mmol/L — ABNORMAL LOW (ref 3.5–5.1)
Sodium: 139 mmol/L (ref 135–145)

## 2018-02-03 MED ORDER — TRAMADOL HCL 50 MG PO TABS: 50.0000 mg | ORAL_TABLET | Freq: Four times a day (QID) | ORAL | 0 refills | Status: AC | PRN

## 2018-02-03 MED ORDER — POTASSIUM CHLORIDE CRYS ER 20 MEQ PO TBCR
40.0000 meq | EXTENDED_RELEASE_TABLET | Freq: Once | ORAL | Status: AC
  Administered 2018-02-03: 40 meq via ORAL
  Filled 2018-02-03: qty 2

## 2018-02-03 MED ORDER — LACTATED RINGERS IV BOLUS (SEPSIS)
500.0000 mL | Freq: Once | INTRAVENOUS | Status: AC
  Administered 2018-02-03: 500 mL via INTRAVENOUS

## 2018-02-03 NOTE — ED Notes (Signed)
Called [facility] for transport to [place] [address], [place] [address]

## 2018-02-03 NOTE — ED Notes (Signed)
Social work at bedside.  

## 2018-02-03 NOTE — Progress Notes (Addendum)
CSW spoke to CM who will contact [facility], PT/OT and social work to assist the pt in the home and to assist with Medicaid placement in the home.   CSW called pt's daughter Traci Meyer at ph: [phone] and explained difficulties with getting pt placed with Medicaid versus Medicare (lack of inpatient 3-day stay) and counseled pt's daughter on [facility] with a HH social worker assisting pt's daughter with finding Medicaid placement.   CSW counseled pt's daughter on need for families in these difficult situations to "step-up" and assist the pt in the home around the schedule of [facility] and that [facility] personnel are not in the home very much but that a timely placement is the plan with assistance [facility].  Daughter was in agreement with this plan.  CSW stated EDP and RN would be updated and pt would be sent home by ambulance and would transport pt directly to her bed and that once placed the SNF will send an ambulance to transport the pt to the SNF placement.  CSW stated he would send a list of SNF's home with the pt and counseled pt's daughter Traci Meyer on how to call Medicaid facilities and speak to admissions regarding the pt's possible placement on her own.  CSW also counseled pt's daughter that pt will have to sign over her disability/SS check for one month and will also have to agree to stay in home for 30 days.  Pt's daughter voiced understanding.  Daughter voiced understanding was appreciative and thanked the CSW.  CSW will update the EDP and RN.  8:30 PM CSW spoke to EDP who is awaiting a final call from CM on [facility] to [facility]'s needs in the home.  CSW will continue to follow for D/C needs.  Traci PeaJonathan F. Frutoso Dimare, LCSW, LCAS, CSI Clinical Social Worker Ph: [phone]        Traci PeaJonathan F. Keyetta Hollingworth, LCSW, LCAS, CSI Clinical Social Worker Ph: [phone]

## 2018-02-03 NOTE — ED Triage Notes (Signed)
Pt from home EMS states pt experienced a witnessed fall today- pt fell out of wheel chair, outward rotation noted to right leg. Pt c/o of 8/10  right knee and hip pain. Pt wheel chair bound.  Denies neck and back pain, no loc, not on blood thinners. A/O to baseline

## 2018-02-03 NOTE — ED Notes (Signed)
Bed: WA04 Expected date:  Expected time:  Means of arrival:  Comments: EMS- elderly fall, leg deformity

## 2018-02-03 NOTE — ED Provider Notes (Addendum)
Patient was discharged prior to my arrival.  However, I was called to the room because family does not feel comfortable taking the patient home.  Patient was seen here for a fall and is found to have a femoral condyle fracture.  She is nonweightbearing at baseline so treatment plan is knee immobilizer and outpatient follow-up.  However, the family does not feel comfortable taking her home.  On my assessment, the patient does appear to be in mild pain but is otherwise afebrile and hemodynamically stable.  She reportedly also has been urinating on herself and they are concerned that they cannot handle her at home.  Given that she does have a new orthopedic injury with severe difficulty ambulating at baseline, I suspect she will likely need skilled nursing placement to get her back to her baseline, at which she is able to pivot.  Will place a social work consult.  I also added on a urinalysis as the patient reports some mild dysuria.    Urinalysis consistent with mild ketonuria but not infection.  Patient given 500 cc of fluid here.  Her potassium has been replaced.  Based on previous discussion with orthopedics, patient does not need to be admitted for orthopedic injury.  I have asked social work to work on possible placement and they are working on this at this time.  Per SW, unable to place pt from ED but pt has been referred for outpt SNF placement. I've involved CM as well and pt will be set up with HHPT and per SW, family can provide care while awaiting SNF placement. Family aware, in agreement, and feels comfortable with this plan to have HHPT set up while seeking SNF placement as outpt with Medicare/Medicaid.   Traci Meyer, Traci Starkey, MD 02/03/18 Traci [id number]    Traci Meyer, Traci Nunziata, MD 02/04/18 [phone]

## 2018-02-03 NOTE — ED Notes (Signed)
Mariea Clontsalled Cheryl, patient's daughter, to let her know that [facility] had arrived to transport her back home.

## 2018-02-03 NOTE — Discharge Instructions (Addendum)
As we discussed, call the nursing facilities and rehab facility first thing in the morning to set up placement.  Please encourage Traci Meyer to eat and drink.

## 2018-02-03 NOTE — Progress Notes (Signed)
CSW spoke with EDP who is aware of CM's note and voiced understanding HH will respond to pt's family.  EDP will D/C pt and RN updated by CSW earlier.  Please reconsult if future social work needs arise.  CSW signing off, as social work intervention is no longer needed.  Dorothe PeaJonathan F. Laiken Nohr, LCSW, LCAS, CSI Clinical Social Worker Ph: [phone]

## 2018-02-03 NOTE — Care Management (Addendum)
[facility] needs vs SNF. ED CSW evaluated patient for SNF placement. As per CSW, he explained to patient's family about the difficulty of placement  with lack of inpatient 3 day stay and [facility] including SW to assist with placement. ED CM contacted patient's daughter Elnita MaxwellCheryl [phone] by phone with [facility], PT,HHA, SW  and explained the disciplines ordered, daughter is agreeable with transitional care plan Offered choice [facility] selected. Explained that a nurse will contact her within 24-48 hours post discharge from the ED, she also stated they have DME at home and family support she verbalized understanding and no further question or concerns verbalized teach back done. [facility]. Patient can be transported home by [facility] family at home waiting. [facility] EDCM attempted to call [facility] several times with updated discharge plan.

## 2018-02-03 NOTE — ED Provider Notes (Addendum)
[facility]-EMERGENCY DEPT Provider Note   CSN: [id number] Arrival date & time: 02/03/18  1055     History   Chief Complaint Chief Complaint  Patient presents with  . Leg Pain  . Fall    HPI Traci Meyer is a 84 y.o. female.  Patient is a 84-year-old female who presents after a fall.  She is wheelchair-bound and nonambulatory at baseline.  She lives at home with her daughter.  She states that she was trying to get up from the wheelchair and slipped out of it falling onto her right side.  She states she did not hit her head.  She denies any neck or back pain.  She complains of pain to her right hip and right knee.  She denies any other complaints of injuries or pain.  She denies any recent illnesses.      Past Medical History:  Diagnosis Date  . Aortic atherosclerosis (HCC)   . Arthritis   . Hypertension     Patient Active Problem List   Diagnosis Date Noted  . Right wrist drop 08/27/2017  . Anasarca 08/25/2017  . Chronic diastolic CHF (congestive heart failure) (HCC) 08/25/2017  . Hypokalemia 08/25/2017  . Chest pain 08/01/2017  . Acute on chronic diastolic heart failure (HCC) 08/01/2017  . Arthritis 08/01/2017  . Hypertension 08/01/2017  . Aortic atherosclerosis (HCC) 08/01/2017    Past Surgical History:  Procedure Laterality Date  . MULTIPLE TOOTH EXTRACTIONS      OB History    No data available       Home Medications    Prior to Admission medications   Medication Sig Start Date End Date Taking? Authorizing Provider  aspirin 81 MG chewable tablet Chew by mouth daily.    [provider]  enalapril (VASOTEC) 20 MG tablet Take 20 mg by mouth 2 (two) times daily.    [provider]  furosemide (LASIX) 20 MG tablet Take 20 mg by mouth daily.    [provider]  furosemide (LASIX) 20 MG tablet Take 1 tablet (20 mg total) by mouth daily. 01/20/18   Arby BarrettePfeiffer, Marcy, MD  furosemide (LASIX) 40 MG tablet Take 1  tablet (40 mg total) by mouth daily. Patient not taking: Reported on 01/20/2018 08/29/17   Lanelle BalHarbrecht, Lawrence, MD  levothyroxine (SYNTHROID, LEVOTHROID) 75 MCG tablet Take 75 mcg by mouth daily before breakfast.    [provider]  meloxicam (MOBIC) 15 MG tablet Take 15 mg by mouth daily.    [provider]  metoprolol succinate (TOPROL-XL) 25 MG 24 hr tablet Take 25 mg by mouth daily.    [provider]  oxybutynin (DITROPAN) 5 MG tablet Take 5 mg by mouth daily.    [provider]  rosuvastatin (CRESTOR) 10 MG tablet Take 10 mg by mouth daily.    [provider]  traMADol (ULTRAM) 50 MG tablet Take 1 tablet (50 mg total) by mouth every 6 (six) hours as needed. 02/03/18   Rolan BuccoBelfi, Bernice Mcauliffe, MD    Family History Family History  Problem Relation Age of Onset  . Heart disease Neg Hx     Social History Social History   Tobacco Use  . Smoking status: Former Games developermoker  . Smokeless tobacco: Never Used  . Tobacco comment: 45 years ago briefly  Substance Use Topics  . Alcohol use: No  . Drug use: No     Allergies   Penicillins   Review of Systems Review of Systems  Constitutional:  Negative for chills, diaphoresis, fatigue and fever.  HENT: Negative for congestion, rhinorrhea and sneezing.   Eyes: Negative.   Respiratory: Negative for cough, chest tightness and shortness of breath.   Cardiovascular: Positive for leg swelling (Chronic). Negative for chest pain.  Gastrointestinal: Negative for abdominal pain, blood in stool, diarrhea, nausea and vomiting.  Genitourinary: Negative for difficulty urinating, flank pain, frequency and hematuria.  Musculoskeletal: Positive for arthralgias. Negative for back pain.  Skin: Negative for rash.  Neurological: Negative for dizziness, speech difficulty, weakness, numbness and headaches.     Physical Exam Updated Vital Signs BP (!) 153/88   Pulse 88   Temp 98.5 F (36.9 C) (Oral)   Resp 18   SpO2 98%     Physical Exam  Constitutional: She is oriented to person, place, and time. She appears well-developed and well-nourished.  Patient has limited general mobility.  She is positioned in the bed on her right side and has difficulty getting into a supine position  HENT:  Head: Normocephalic and atraumatic.  Eyes: Pupils are equal, round, and reactive to light.  Neck: Normal range of motion. Neck supple.  There is no pain to the cervical thoracic or lumbosacral spine  Cardiovascular: Normal rate, regular rhythm and normal heart sounds.  Pulmonary/Chest: Effort normal and breath sounds normal. No respiratory distress. She has no wheezes. She has no rales. She exhibits no tenderness.  Abdominal: Soft. Bowel sounds are normal. There is no tenderness. There is no rebound and no guarding.  Musculoskeletal: Normal range of motion. She exhibits edema (3+ pitting edema bilaterally).  She has pain on palpation of her right knee and right hip.  There is no pain to the lower leg or ankle.  There is no pain on range of motion of the other extremities.  Pedal pulses are intact.  Lymphadenopathy:    She has no cervical adenopathy.  Neurological: She is alert and oriented to person, place, and time.  Skin: Skin is warm and dry. No rash noted.  Psychiatric: She has a normal mood and affect.     ED Treatments / Results  Labs (all labs ordered are listed, but only abnormal results are displayed) Labs Reviewed  CBC WITH DIFFERENTIAL/PLATELET - Abnormal; Notable for the following components:      Result Value   RBC 3.15 (*)    Hemoglobin 9.8 (*)    HCT 28.9 (*)    RDW 15.8 (*)    Platelets 447 (*)    Neutro Abs 8.9 (*)    All other components within normal limits  BASIC METABOLIC PANEL - Abnormal; Notable for the following components:   Potassium 3.2 (*)    All other components within normal limits    EKG  EKG Interpretation None       Radiology Dg Knee Complete 4 Views Right  Result Date:  02/03/2018 CLINICAL DATA:  Witnessed fall today, fell out of wheelchair, our rotation of RIGHT leg, RIGHT hip and RIGHT knee pain rated at 8/10 EXAM: RIGHT KNEE - COMPLETE 4+ VIEW COMPARISON:  None FINDINGS: Diffuse osseous demineralization. Marked joint space narrowing and spur formation tricompartmental. Displaced fracture at medial distal RIGHT femoral metaphysis. Associated large knee joint effusion. No additional fracture, dislocation, or bone destruction. IMPRESSION: Mildly displaced fracture of the medial distal RIGHT femoral metaphysis with associated large knee joint effusion. Osseous demineralization with advanced tricompartmental osteoarthritic changes. Electronically Signed   By: Ulyses Southward M.D.   On: 02/03/2018 12:44   Dg Hip Unilat  With Pelvis 2-3 Views Right  Result Date: 02/03/2018 CLINICAL DATA:  Witnessed fall today, fell out of wheelchair, our rotation of RIGHT leg, RIGHT hip and RIGHT knee pain rated at 8/10 EXAM: DG HIP (WITH OR WITHOUT PELVIS) 2-3V RIGHT COMPARISON:  None FINDINGS: Osseous demineralization. Advanced degenerative changes of BILATERAL hip joints with joint space narrowing, spur formation and minimal subchondral cyst formation. Deformities of the femoral heads bilaterally secondary to degenerative changes. No acute fracture, dislocation, or bone destruction. IMPRESSION: Advanced osteoarthritic changes of both hip joints. No acute abnormalities. Electronically Signed   By: Ulyses Southward M.D.   On: 02/03/2018 12:51    Procedures Procedures (including critical care time)  Medications Ordered in ED Medications  potassium chloride SA (K-DUR,KLOR-CON) CR tablet 40 mEq (not administered)     Initial Impression / Assessment and Plan / ED Course  I have reviewed the triage vital signs and the nursing notes.  Pertinent labs & imaging results that were available during my care of the patient were reviewed by me and considered in my medical decision making (see chart for  details).     Patient is a 84 year old female who presents after a fall.  It sounds like a mechanical fall.  She does not have any reported change in mental status or recent.  There is no head injury per the patient.  She has some anemia on blood work but this appears to be similar to her baseline values.  She had some mild hypokalemia which was replaced with oral potassium.  On imaging, she has a fracture of the distal medial femur metaphysis.  I spoke with Dr. Charlann Boxer with orthopedics who advises to place the patient in a knee immobilizer.  This was done by the [facility].  I spoke with the patient's daughter.  The patient's daughter will have the patient follow-up with orthopedist.  The patient's daughter is just getting off work and will meet the patient at home.  Will transport by [facility].  Final Clinical Impressions(s) / ED Diagnoses   Final diagnoses:  Fall, initial encounter  Closed fracture of distal end of femur, unspecified fracture morphology, initial encounter [facility])    ED Discharge Orders        Ordered    traMADol (ULTRAM) 50 MG tablet  Every 6 hours PRN     02/03/18 1511       Rolan Bucco, MD 02/03/18 1514    Rolan Bucco, MD 02/03/18 1515

## 2018-02-03 NOTE — ED Notes (Signed)
Traci Meyer Traci Meyer, patient's daughter, to let her know that I have called [facility] for transportation to home.

## 2018-02-03 NOTE — Progress Notes (Addendum)
Consult request has been received. CSW attempting to follow up at present time.  CSW received a call from ED secretary, family requesting info on placment.  Per EDP and RN pt has a fracture at head of femur that doesn't require inpatient placement.  Per EDP/RN pt at baseline is non-ambulatory and uses a wheelchair around the home.  In addition, at baseline pt can pivot when standing to transfer to a toilet.  Per RN pt is A&Ox4  Per EDP and RN the only difference before and after fracture is pt can no linger pivot when standing. Pt has Medicare A&B and Medicaid.  Since no 3-day inpatient stay is possible with Medicare A&B pt would have to have Medicaid SNF stay.    CSW staffed with CSW Asst Director who stated [facility] and others have Medicaid are too. [facility] and [facility] are [facility] and CSW received no call back from [facility].  In addition pt is likely to require a Medicaid authorization as well as a PT recommendation and this could take days.  CSW at direction  of CSW Asst Director requested EDP place a [facility] with order for Face to Face and PT/OT/RN/Aide and Child psychotherapistocial Worker.  Plan: Pt [facility]/RN/Aide and a HH social worker to assist with Medicaid placement from the home.  CSW consulted with EDP who agrees with plan if family is agreeable.  Dorothe PeaJonathan F. Arlando Leisinger, LCSW, LCAS, CSI Clinical Social Worker Ph: [phone]

## 2018-03-20 ENCOUNTER — Encounter (HOSPITAL_COMMUNITY): Payer: Self-pay | Admitting: Emergency Medicine

## 2018-03-20 DIAGNOSIS — Z87891 Personal history of nicotine dependence: Secondary | ICD-10-CM | POA: Diagnosis not present

## 2018-03-20 DIAGNOSIS — I5032 Chronic diastolic (congestive) heart failure: Secondary | ICD-10-CM | POA: Diagnosis not present

## 2018-03-20 DIAGNOSIS — M25561 Pain in right knee: Secondary | ICD-10-CM | POA: Diagnosis not present

## 2018-03-20 DIAGNOSIS — Z8781 Personal history of (healed) traumatic fracture: Secondary | ICD-10-CM | POA: Diagnosis not present

## 2018-03-20 DIAGNOSIS — M79604 Pain in right leg: Secondary | ICD-10-CM | POA: Diagnosis present

## 2018-03-20 DIAGNOSIS — I110 Hypertensive heart disease with heart failure: Secondary | ICD-10-CM | POA: Diagnosis not present

## 2018-03-20 DIAGNOSIS — N3000 Acute cystitis without hematuria: Secondary | ICD-10-CM | POA: Diagnosis not present

## 2018-03-20 DIAGNOSIS — Z79899 Other long term (current) drug therapy: Secondary | ICD-10-CM | POA: Insufficient documentation

## 2018-03-20 LAB — COMPREHENSIVE METABOLIC PANEL
ALBUMIN: 3.6 g/dL (ref 3.5–5.0)
ALT: 12 U/L — ABNORMAL LOW (ref 14–54)
AST: 22 U/L (ref 15–41)
Alkaline Phosphatase: 94 U/L (ref 38–126)
Anion gap: 14 (ref 5–15)
BUN: 24 mg/dL — AB (ref 6–20)
CHLORIDE: 102 mmol/L (ref 101–111)
CO2: 23 mmol/L (ref 22–32)
Calcium: 10 mg/dL (ref 8.9–10.3)
Creatinine, Ser: 0.87 mg/dL (ref 0.44–1.00)
GFR calc Af Amer: >60 mL/min (ref >60)
GFR, EST NON AFRICAN AMERICAN: 59 mL/min — AB (ref >60)
Glucose, Bld: 122 mg/dL — ABNORMAL HIGH (ref 65–99)
POTASSIUM: 4 mmol/L (ref 3.5–5.1)
SODIUM: 139 mmol/L (ref 135–145)
Total Bilirubin: 0.4 mg/dL (ref 0.3–1.2)
Total Protein: 7.2 g/dL (ref 6.5–8.1)

## 2018-03-20 LAB — URINALYSIS, ROUTINE W REFLEX MICROSCOPIC
Bilirubin Urine: NEGATIVE
GLUCOSE, UA: NEGATIVE mg/dL
Ketones, ur: 20 mg/dL — AB
Nitrite: POSITIVE — AB
PROTEIN: 100 mg/dL — AB
SPECIFIC GRAVITY, URINE: 1.015 (ref 1.005–1.030)
pH: 5 (ref 5.0–8.0)

## 2018-03-20 LAB — CBC WITH DIFFERENTIAL/PLATELET
Basophils Absolute: 0 10*3/uL (ref 0.0–0.1)
Basophils Relative: 0 %
EOS PCT: 2 %
Eosinophils Absolute: 0.2 10*3/uL (ref 0.0–0.7)
HCT: 36.7 % (ref 36.0–46.0)
Hemoglobin: 11.9 g/dL — ABNORMAL LOW (ref 12.0–15.0)
LYMPHS ABS: 1.8 10*3/uL (ref 0.7–4.0)
LYMPHS PCT: 21 %
MCH: 30 pg (ref 26.0–34.0)
MCHC: 32.4 g/dL (ref 30.0–36.0)
MCV: 92.4 fL (ref 78.0–100.0)
MONO ABS: 0.4 10*3/uL (ref 0.1–1.0)
Monocytes Relative: 4 %
Neutro Abs: 6.2 10*3/uL (ref 1.7–7.7)
Neutrophils Relative %: 73 %
PLATELETS: 447 10*3/uL — AB (ref 150–400)
RBC: 3.97 MIL/uL (ref 3.87–5.11)
RDW: 16.6 % — AB (ref 11.5–15.5)
WBC: 8.6 10*3/uL (ref 4.0–10.5)

## 2018-03-20 LAB — I-STAT CG4 LACTIC ACID, ED: LACTIC ACID, VENOUS: 1.88 mmol/L (ref 0.5–1.9)

## 2018-03-20 NOTE — ED Triage Notes (Signed)
Pt Traci Meyer, pt from home,  c/o right arm swelling for the past two weeks but "it hurts worser today".  "The pain is terrible."  No other complaints at this time.

## 2018-03-21 ENCOUNTER — Emergency Department (HOSPITAL_COMMUNITY)
Admission: EM | Admit: 2018-03-21 | Discharge: 2018-03-22 | Disposition: A | Discharge status: Home / Self Care | Payer: Medicare Other | Attending: Emergency Medicine | Admitting: Emergency Medicine

## 2018-03-21 ENCOUNTER — Emergency Department (HOSPITAL_BASED_OUTPATIENT_CLINIC_OR_DEPARTMENT_OTHER)
Admit: 2018-03-21 | Discharge: 2018-03-21 | Disposition: A | Discharge status: Home / Self Care | Payer: Medicare Other | Attending: Student | Admitting: Student

## 2018-03-21 DIAGNOSIS — R609 Edema, unspecified: Secondary | ICD-10-CM

## 2018-03-21 DIAGNOSIS — M25561 Pain in right knee: Secondary | ICD-10-CM

## 2018-03-21 DIAGNOSIS — N3000 Acute cystitis without hematuria: Secondary | ICD-10-CM

## 2018-03-21 LAB — URINALYSIS, ROUTINE W REFLEX MICROSCOPIC
BILIRUBIN URINE: NEGATIVE
Glucose, UA: NEGATIVE mg/dL
Hgb urine dipstick: NEGATIVE
Ketones, ur: NEGATIVE mg/dL
Nitrite: NEGATIVE
Protein, ur: 30 mg/dL — AB
SPECIFIC GRAVITY, URINE: 1.024 (ref 1.005–1.030)
pH: 5 (ref 5.0–8.0)

## 2018-03-21 MED ORDER — HYDROCODONE-ACETAMINOPHEN 5-325 MG PO TABS
1.0000 | ORAL_TABLET | Freq: Once | ORAL | Status: AC
  Administered 2018-03-21: 1 via ORAL
  Filled 2018-03-21: qty 1

## 2018-03-21 MED ORDER — SULFAMETHOXAZOLE-TRIMETHOPRIM 800-160 MG PO TABS
1.0000 | ORAL_TABLET | Freq: Once | ORAL | Status: AC
  Administered 2018-03-22: 1 via ORAL
  Filled 2018-03-21: qty 1

## 2018-03-21 MED ORDER — TRAMADOL HCL 50 MG PO TABS: 50.0000 mg | ORAL_TABLET | Freq: Four times a day (QID) | ORAL | Status: DC | PRN

## 2018-03-21 MED ORDER — SULFAMETHOXAZOLE-TRIMETHOPRIM 800-160 MG PO TABS: 1.0000 | ORAL_TABLET | Freq: Two times a day (BID) | ORAL | 0 refills | Status: DC

## 2018-03-21 MED ORDER — IBUPROFEN 400 MG PO TABS
600.0000 mg | ORAL_TABLET | Freq: Once | ORAL | Status: AC
  Administered 2018-03-21: 07:00:00 600 mg via ORAL
  Filled 2018-03-21: qty 1

## 2018-03-21 MED ORDER — SULFAMETHOXAZOLE-TRIMETHOPRIM 800-160 MG PO TABS
1.0000 | ORAL_TABLET | Freq: Once | ORAL | Status: AC
  Administered 2018-03-21: 1 via ORAL
  Filled 2018-03-21: qty 1

## 2018-03-21 MED ORDER — SULFAMETHOXAZOLE-TRIMETHOPRIM 800-160 MG PO TABS
1.0000 | ORAL_TABLET | Freq: Once | ORAL | Status: AC
Start: 2018-03-21 — End: 2018-03-21
  Administered 2018-03-21: 1 via ORAL
  Filled 2018-03-21: qty 1

## 2018-03-21 MED ORDER — ACETAMINOPHEN 500 MG PO TABS: 1000.0000 mg | ORAL_TABLET | Freq: Four times a day (QID) | ORAL | Status: DC | PRN

## 2018-03-21 NOTE — Care Management Note (Signed)
Case Management Note  [facility] and it was reported to CM that pt has not had HH since her last ED visit on 02/03/2018.  CM [facility] who advised the pt was never opened after the first intake evaluation due to an unsafe living environment.  At baseline, prior to injury, pt was bed/wheelchair bound and could not get out of her home safely due to multiple steps and other barriers.  Pt also does not have a current PCP due to the family not being able to get pt out of the home.  The home has bedbugs.  Lupita LeashDonna advised that they would not be able to accept pt back.  CM noted pt has Medicaid and contacted CM/CSW Director to discuss case.  CM contacted CSW to discuss with pt possible SNF placement per director's instructions.  If pt refuses placement pt will be [facility] home without [facility].  Updated EDPA.  No further CM needs noted at this time.

## 2018-03-21 NOTE — ED Triage Notes (Signed)
PT  Moved to room 8 to wait for SNF  Placement  On Tuesday.

## 2018-03-21 NOTE — Clinical Social Work Note (Addendum)
Pt and pt's daughter Elnita Maxwell(Cheryl) agreeable to long term SNF placement, at this time. [facility] will [facility] bed [facility]. PT to evaluate Pt. PA updated.   KewauneeBridget Joyce Leckey, [place]WA [phone]

## 2018-03-21 NOTE — ED Notes (Signed)
Pt given orange juice.

## 2018-03-21 NOTE — Progress Notes (Addendum)
[facility] and spoke with Clydie BraunKaren. Clydie BraunKaren informed CSW that pt would not be a good fit [facility]. CSW will continue to follow up with other long term care facilities.   Updated: [facility]. [facility]   5:30 [facility] rescinded bed offer. CSW updated pt and pt's family.   7:00 Pm [facility]. CSW called to confirm acceptance, admissions is gone for the day.   CSW will provide handoff to daytime ED CSW [facility].   Montine CircleKelsy Tajuana Kniskern, Silverio LayLCSWA [facility]  [phone]

## 2018-03-21 NOTE — ED Provider Notes (Addendum)
Care assumed from previous provider PA upstill. Please see their note for further details to include full history and physical. To summarize in short pt is a 84 year old female presents to the ED with right knee pain for the past month after mechanical fall with mildly displaced fracture of right femoral metaphysis.  Patient is wheelchair-bound.  Was supposed to have PT and OT follow-up in the home however this was not completed.. Case discussed, plan agreed upon.  Care handoff was awaiting DVT ultrasound of right leg that showed no acute findings.  Patient does have a UTI and was started on Bactrim provider provider.  Was awaiting case manager for home health and occupational and physical therapy.  I spoke with case manager at [facility] who states that patient did have home health come evaluate patient however her home is not safe for PT and OT.  Discussed with social worker who states that patient does meet criteria for long-term placement and patient is agreeable.  They are talking to patient's daughter and they will make recommendations and try to get patient placement.  She is stable at this time.  I am awaiting social worker's recommendations and disposition at this time.  Care handoff to Dr. Clarene DukeLittle. Pt has pending at this time CSW disposition for placement or pt can be discharged home with antibiotics and to continue tramadol.. Care dicussed and plan agreed upon with oncoming PA. Pt updated on plan of care and is currently hemodynamically stable at this time with normal vs.    At 430 was notified by social work that patient does meet criteria for placement.  Patient and family member are agreeable.  They have faxed paperwork out to facility at this time they are waiting for response.  They feel that patient will likely be transferred to facility tomorrow morning has asked that we border the patient.  Patient is hemodynamically stable this time.  Will order home medicines. However pt has not been  taking consistenly and needs to see pcp before I restart meds that she has not taken in weeks.  Again patient is being treated for UTI per prior providers workup.  Patient given Bactrim in the ED will be dose additionally this evening.  Patient will need to complete a 3-day course.  If patient is kept in the ED overnight she will need to be given Bactrim in the morning.  Informed nurse that patient can be moved to pod R for border until patient is placed in facility tomorrow morning.  Have ordered as needed pain medication for her knee pain as needed.        Rise MuLeaphart, Kenneth T, PA-C 03/21/18 1535    Rise MuLeaphart, Kenneth T, PA-C 03/21/18 1634    Tilden Fossaees, Elizabeth, MD 03/22/18 [phone]

## 2018-03-21 NOTE — ED Provider Notes (Signed)
[facility] Provider Note   CSN: [id number] Arrival date & time: 03/20/18  2140     History   Chief Complaint Chief Complaint  Patient presents with  . Leg Swelling    HPI Traci Meyer is a 84 y.o. female.  Patient presents with complaint of right knee pain going on for the past one month. She reports injury to this leg after fall and pain since. No recent fall. She reports swelling isolated to the knee and denies calf or thigh pain. No fever. She also reports soreness in bilateral shoulders but feels this happened when her grandson pulled her up in her bed yesterday. She states she takes medication at home for pain but this does not help. She is unable to say what the medication is.  The history is provided by the patient. No language interpreter was used.    Past Medical History:  Diagnosis Date  . Aortic atherosclerosis (HCC)   . Arthritis   . Hypertension     Patient Active Problem List   Diagnosis Date Noted  . Right wrist drop 08/27/2017  . Anasarca 08/25/2017  . Chronic diastolic CHF (congestive heart failure) (HCC) 08/25/2017  . Hypokalemia 08/25/2017  . Chest pain 08/01/2017  . Acute on chronic diastolic heart failure (HCC) 08/01/2017  . Arthritis 08/01/2017  . Hypertension 08/01/2017  . Aortic atherosclerosis (HCC) 08/01/2017    Past Surgical History:  Procedure Laterality Date  . MULTIPLE TOOTH EXTRACTIONS       OB History   None      Home Medications    Prior to Admission medications   Medication Sig Start Date End Date Taking? Authorizing Provider  enalapril (VASOTEC) 20 MG tablet Take 20 mg by mouth daily.     [provider]  furosemide (LASIX) 20 MG tablet Take 1 tablet (20 mg total) by mouth daily. Patient not taking: Reported on 02/03/2018 01/20/18   Arby BarrettePfeiffer, Marcy, MD  furosemide (LASIX) 40 MG tablet Take 1 tablet (40 mg total) by mouth daily. 08/29/17   Lanelle BalHarbrecht, Lawrence, MD    levothyroxine (SYNTHROID, LEVOTHROID) 75 MCG tablet Take 75 mcg by mouth daily before breakfast.    [provider]  meloxicam (MOBIC) 15 MG tablet Take 15 mg by mouth daily.    [provider]  metoprolol succinate (TOPROL-XL) 25 MG 24 hr tablet Take 25 mg by mouth daily.    [provider]  oxybutynin (DITROPAN) 5 MG tablet Take 5 mg by mouth daily.    [provider]  rosuvastatin (CRESTOR) 10 MG tablet Take 10 mg by mouth daily.    [provider]  traMADol (ULTRAM) 50 MG tablet Take 1 tablet (50 mg total) by mouth every 6 (six) hours as needed. 02/03/18   Rolan BuccoBelfi, Melanie, MD    Family History Family History  Problem Relation Age of Onset  . Heart disease Neg Hx     Social History Social History   Tobacco Use  . Smoking status: Former Games developermoker  . Smokeless tobacco: Never Used  . Tobacco comment: 45 years ago briefly  Substance Use Topics  . Alcohol use: No  . Drug use: No     Allergies   Penicillins   Review of Systems Review of Systems  Constitutional: Negative for chills and fever.  Respiratory: Negative.   Cardiovascular: Negative.   Gastrointestinal: Negative.  Negative for abdominal pain.  Genitourinary: Positive for dysuria and frequency.  Musculoskeletal:  See HPI.  Skin: Negative.  Negative for color change and wound.  Neurological: Negative.      Physical Exam Updated Vital Signs BP 112/75   Pulse 72   Temp 98.5 F (36.9 C) (Oral)   Resp 16   Ht 5\' 3"  (1.6 m)   Wt 90.7 kg (200 lb)   SpO2 97%   BMI 35.43 kg/m   Physical Exam  Constitutional: She is oriented to person, place, and time. She appears well-developed and well-nourished.  Neck: Normal range of motion.  Cardiovascular: Intact distal pulses.  Pulmonary/Chest: Effort normal.  Abdominal: Soft. There is no tenderness.  Musculoskeletal:  Moderate swelling of right knee without warmth or discoloration. Joint stable. No calf or thigh  tenderness or swelling. Distal pulses are present and equal bilaterally. Distal right leg cooler to touch without discoloration.  Neurological: She is alert and oriented to person, place, and time.  Skin: Skin is warm and dry.     ED Treatments / Results  Labs (all labs ordered are listed, but only abnormal results are displayed) Labs Reviewed  COMPREHENSIVE METABOLIC PANEL - Abnormal; Notable for the following components:      Result Value   Glucose, Bld 122 (*)    BUN 24 (*)    ALT 12 (*)    GFR calc non Af Amer 59 (*)    All other components within normal limits  URINALYSIS, ROUTINE W REFLEX MICROSCOPIC - Abnormal; Notable for the following components:   APPearance CLOUDY (*)    Hgb urine dipstick SMALL (*)    Ketones, ur 20 (*)    Protein, ur 100 (*)    Nitrite POSITIVE (*)    Leukocytes, UA LARGE (*)    Bacteria, UA MANY (*)    Squamous Epithelial / LPF 6-30 (*)    Non Squamous Epithelial 0-5 (*)    All other components within normal limits  CBC WITH DIFFERENTIAL/PLATELET - Abnormal; Notable for the following components:   Hemoglobin 11.9 (*)    RDW 16.6 (*)    Platelets 447 (*)    All other components within normal limits  URINALYSIS, ROUTINE W REFLEX MICROSCOPIC - Abnormal; Notable for the following components:   APPearance CLOUDY (*)    Protein, ur 30 (*)    Leukocytes, UA LARGE (*)    Bacteria, UA MANY (*)    Squamous Epithelial / LPF 6-30 (*)    All other components within normal limits  I-STAT CG4 LACTIC ACID, ED  I-STAT CG4 LACTIC ACID, ED    EKG None  Radiology No results found.  Procedures Procedures (including critical care time)  Medications Ordered in ED Medications  ibuprofen (ADVIL,MOTRIN) tablet 600 mg (has no administration in time range)  HYDROcodone-acetaminophen (NORCO/VICODIN) 5-325 MG per tablet 1 tablet (1 tablet Oral Given 03/21/18 0352)     Initial Impression / Assessment and Plan / ED Course  I have reviewed the triage vital  signs and the nursing notes.  Pertinent labs & imaging results that were available during my care of the patient were reviewed by me and considered in my medical decision making (see chart for details).     Patient presents with complaint of right knee pain. Chart reviewed. She was seen on 02/03/18 after a fall from her wheelchair which showed a mildly displaced fx of right femoral metaphysis. As the patient is wheechair bound at home, outpatient follow up was considered appropriate. Home health was to be arranged by CM for RN, PT/OT and  daughter was to arrange orthopedic follow up.   There is some question whether or not home health has been arranged as the patient reports no one comes to her home to see her. Will keep the patient until morning and have CM follow up on in home services to insure they are being provided.   Pain managed. She is found to have a UTI. On further interview, she reports she is having burning with urination. Will start abx. VSS. Anticipate discharge home.   Patient care signed out to [facility], PA-C, to follow up on CM report on patient's in home services. Doppler of right LE ordered to insure no DVT that contributes to pain.   Final Clinical Impressions(s) / ED Diagnoses   Final diagnoses:  None   1. Right LE pain 2. History of recent femur fracture 3. UTI  ED Discharge Orders    None       Elpidio Anis, PA-C 03/21/18 1610    Derwood Kaplan, MD 03/21/18 2117

## 2018-03-21 NOTE — Evaluation (Signed)
Physical Therapy Evaluation Patient Details Name: Traci Meyer MRN: [id number] DOB: [birth year] Today's Date: 03/21/2018   History of Present Illness  84 y.o. female admitted to the ED on 03/21/18 for bil shoulder soreness and R knee pain.  DVT of R leg ruled out.  Pt with other significant PMH of R distal femur condylar fx (displaced treated conservatively with KI), arthritis, HTN, and aortic athrosclerosis.    Clinical Impression  Pt is very weak and deconditioned from over a month flat in the bed.  She reports she has not stood in over a month or even gotten sitting on the side of the bed.  She is essentially bed bound.  She did work very hard with me getting up to the side of the bed to sit for a short period of time (~15 mins).  She is a Chief Executive Officerhard worker and I believe she would try to rehab if placed in a SNF for rehab setting.   PT to follow acutely for deficits listed below.       Follow Up Recommendations SNF    Equipment Recommendations  Wheelchair (measurements PT);Wheelchair cushion (measurements PT);Hospital bed;Other (comment)(hoyer lift)    Recommendations for Other Services       Precautions / Restrictions Precautions Precautions: Fall Precaution Comments: pt reports she has not been OOB in over a month.   Required Braces or Orthoses: Knee Immobilizer - Right(per chart and pt, not on currently.) Restrictions Weight Bearing Restrictions: Yes RLE Weight Bearing: Non weight bearing Other Position/Activity Restrictions: presumed NWB due to active fx.       Mobility  Bed Mobility Overal bed mobility: Needs Assistance Bed Mobility: Supine to Sit;Sit to Supine     Supine to sit: Mod assist;HOB elevated Sit to supine: Mod assist;HOB elevated   General bed mobility comments: Mod assist to help alternately support legs and then trunk to get to sitting EOB.  ED gurney was so high that I had to pull a chair up for her to rest her feet in so that she could have support for her  right knee vs dangling over the EOB which I knew she could not tolerate.  Pt holding edge of bed and bedrail for support to keep ther trunk upright.  She has very little if any trunk strength as her ribcage is essentailly sitting on her pelvis.    Transfers                 General transfer comment: Not safe to attempt even with two people at this time.  Pt is too weak.  Ambulation/Gait             General Gait Details: unable at this time      Balance Overall balance assessment: Needs assistance Sitting-balance support: Feet supported;Bilateral upper extremity supported Sitting balance-Leahy Scale: Poor Sitting balance - Comments: mod assist to support trunk in sitting EOB.                                      Pertinent Vitals/Pain Pain Assessment: Faces Faces Pain Scale: Hurts whole lot Pain Location: right knee with mobility Pain Descriptors / Indicators: Grimacing;Guarding Pain Intervention(s): Limited activity within patient's tolerance;Monitored during session;Repositioned    Home Living Family/patient expects to be discharged to:: Skilled nursing facility                 Additional Comments: Pt reports she  has been to [facility] in the past and would like to return there as she is strugging at home and does not qualify for home care services due to poor home environment.     Prior Function Level of Independence: Needs assistance   Gait / Transfers Assistance Needed: Pt needs assist for all levels of mobility.  She has been bed bound for over a month, she has not even sat on the side of the bed, she assists some in rolling for toileting from bed level and for bathing from bed level.    ADL's / Homemaking Assistance Needed: max assist.         Hand Dominance   Dominant Hand: Right    Extremity/Trunk Assessment   Upper Extremity Assessment Upper Extremity Assessment: RUE deficits/detail RUE Deficits / Details: right arm is  particularly impaired.  Pt is unable to raise it above 90 degrees and has a firm end feel.      Lower Extremity Assessment Lower Extremity Assessment: RLE deficits/detail;LLE deficits/detail RLE Deficits / Details: right leg is significantly impaired at the knee (did not test flexion/extension due to active displaced fx and no KI in room currently.  Pt is able to move R ankle 3/5, and minimal hip due to limited by pain caused with hip motion at the kneed.  LLE Deficits / Details: left leg is mildy stronger and significantly less painful than right leg.  ankle 3/5, knee 2/5, hip 2/5.     Cervical / Trunk Assessment Cervical / Trunk Assessment: Kyphotic  Communication   Communication: No difficulties  Cognition Arousal/Alertness: Awake/alert Behavior During Therapy: WFL for tasks assessed/performed Overall Cognitive Status: Within Functional Limits for tasks assessed                                 General Comments: Not specifically tested, but pleasant and conversant.              Assessment/Plan    PT Assessment Patient needs continued PT services  PT Problem List Decreased strength;Decreased range of motion;Decreased activity tolerance;Decreased balance;Decreased mobility;Decreased knowledge of use of DME;Decreased knowledge of precautions;Pain       PT Treatment Interventions DME instruction;Functional mobility training;Therapeutic activities;Therapeutic exercise;Balance training;Patient/family education;Wheelchair mobility training    PT Goals (Current goals can be found in the Care Plan section)  Acute Rehab PT Goals Patient Stated Goal: to go to rehab and get back on her feet when her R knee heals.  PT Goal Formulation: With patient Time For Goal Achievement: 04/04/18 Potential to Achieve Goals: Good    Frequency Min 2X/week   Barriers to discharge Inaccessible home environment;Decreased caregiver support sounds like she is struggling at home in a  questionable living environment.        AM-PAC PT "6 Clicks" Daily Activity  Outcome Measure Difficulty turning over in bed (including adjusting bedclothes, sheets and blankets)?: Unable Difficulty moving from lying on back to sitting on the side of the bed? : Unable Difficulty sitting down on and standing up from a chair with arms (e.g., wheelchair, bedside commode, etc,.)?: Unable Help needed moving to and from a bed to chair (including a wheelchair)?: Total Help needed walking in hospital room?: Total Help needed climbing 3-5 steps with a railing? : Total 6 Click Score: 6    End of Session   Activity Tolerance: Patient limited by fatigue;Patient limited by pain Patient left: in bed;with call bell/phone within  reach   PT Visit Diagnosis: Muscle weakness (generalized) (M62.81);Difficulty in walking, not elsewhere classified (R26.2);Pain Pain - Right/Left: Right Pain - part of body: Knee    Time: 1610-9604 PT Time Calculation (min) (ACUTE ONLY): 37 min   Charges:           Lurena Joiner B. Michaelpaul Apo, PT, DPT [phone]   PT Evaluation $PT Eval Moderate Complexity: 1 Mod PT Treatments $Therapeutic Activity: 8-22 mins   03/21/2018, 4:43 PM

## 2018-03-21 NOTE — Progress Notes (Signed)
*  Preliminary Results* Right lower extremity venous duplex completed. Right lower extremity is negative for deep vein thrombosis. There is no evidence of right Baker's cyst.  03/21/2018 11:19 AM  Gertie FeyMichelle Larita Deremer, BS, RVT, RDCS, RDMS

## 2018-03-21 NOTE — ED Notes (Signed)
Pt given turkey sandwich and applesauce.  

## 2018-03-21 NOTE — NC FL2 (Signed)
[place] MEDICAID FL2 LEVEL OF CARE SCREENING TOOL     IDENTIFICATION  Patient Name: Traci Meyer Birthdate: 1931/12/19 Sex: female Admission Date (Current Location): 03/21/2018  [place]:  [place] [id number] Facility and Address:  [facility], [address], [place], [place] [address]      Provider Number: [id number]  Attending Physician Name and Address:  Default, Provider, MD  Relative Name and Phone Number:  Alfonzo FellerCheryl Parker [phone]    Current Level of Care: Home Recommended Level of Care: Nursing Facility Prior Approval Number: 08/26/2017  Date Approved/Denied: 08/26/17 PASRR Number: [id number]  Discharge Plan: SNF(Long Term)    Current Diagnoses: Patient Active Problem List   Diagnosis Date Noted  . Right wrist drop 08/27/2017  . Anasarca 08/25/2017  . Chronic diastolic CHF (congestive heart failure) (HCC) 08/25/2017  . Hypokalemia 08/25/2017  . Chest pain 08/01/2017  . Acute on chronic diastolic heart failure (HCC) 08/01/2017  . Arthritis 08/01/2017  . Hypertension 08/01/2017  . Aortic atherosclerosis (HCC) 08/01/2017    Orientation RESPIRATION BLADDER Height & Weight     Self, Time, Situation, Place  Normal Incontinent Weight: 200 lb (90.7 kg) Height:  5\' 3"  (160 cm)  BEHAVIORAL SYMPTOMS/MOOD NEUROLOGICAL BOWEL NUTRITION STATUS      Incontinent (See AVS)  AMBULATORY STATUS COMMUNICATION OF NEEDS Skin   Extensive Assist(Wheelchair/ bed bound the last month)   Normal                       Personal Care Assistance Level of Assistance  Bathing, Feeding, Dressing, Total care Bathing Assistance: Maximum assistance Feeding assistance: Limited assistance Dressing Assistance: Maximum assistance Total Care Assistance: Maximum assistance   Functional Limitations Info             SPECIAL CARE FACTORS FREQUENCY  PT (By licensed PT), OT (By licensed OT)     PT Frequency: 5 OT Frequency: 5             Contractures      Additional Factors Info  Code Status, Allergies Code Status Info: Prior Allergies Info: Penicillins           Current Medications (03/21/2018):  This is the current hospital active medication list Current Facility-Administered Medications  Medication Dose Route Frequency Provider Last Rate Last Dose  . acetaminophen (TYLENOL) tablet 1,000 mg  1,000 mg Oral Q6H PRN Demetrios LollLeaphart, Kenneth T, PA-C      . sulfamethoxazole-trimethoprim (BACTRIM DS,SEPTRA DS) 800-160 MG per tablet 1 tablet  1 tablet Oral Once Rise MuLeaphart, Kenneth T, PA-C      . [START ON 03/22/2018] sulfamethoxazole-trimethoprim (BACTRIM DS,SEPTRA DS) 800-160 MG per tablet 1 tablet  1 tablet Oral Once Leaphart, Kenneth T, PA-C      . traMADol Janean Sark(ULTRAM) tablet 50 mg  50 mg Oral Q6H PRN Rise MuLeaphart, Kenneth T, PA-C       Current Outpatient Medications  Medication Sig Dispense Refill  . acetaminophen (TYLENOL) 500 MG tablet Take 1,000 mg by mouth every 6 (six) hours as needed for mild pain.    . diphenhydrAMINE (BENADRYL) 25 mg capsule Take 50 mg by mouth every 6 (six) hours as needed for itching or sleep.    . enalapril (VASOTEC) 20 MG tablet Take 20 mg by mouth daily.     Marland Kitchen. ibuprofen (ADVIL,MOTRIN) 200 MG tablet Take 400 mg by mouth every 6 (six) hours as needed for headache or mild pain.    Drema Balzarine. IBUPROFEN-DIPHENHYDRAMINE HCL PO  Take 2 tablets by mouth as needed (sleep).    Marland Kitchen levothyroxine (SYNTHROID, LEVOTHROID) 75 MCG tablet Take 75 mcg by mouth daily before breakfast.    . meloxicam (MOBIC) 15 MG tablet Take 15 mg by mouth daily.    . metoprolol succinate (TOPROL-XL) 25 MG 24 hr tablet Take 25 mg by mouth daily.    Marland Kitchen oxybutynin (DITROPAN) 5 MG tablet Take 5 mg by mouth daily.    . rosuvastatin (CRESTOR) 10 MG tablet Take 10 mg by mouth daily.    . traMADol (ULTRAM) 50 MG tablet Take 1 tablet (50 mg total) by mouth every 6 (six) hours as needed. 15 tablet 0  . furosemide (LASIX) 20 MG tablet Take 1 tablet (20  mg total) by mouth daily. (Patient not taking: Reported on 02/03/2018) 30 tablet 0  . furosemide (LASIX) 40 MG tablet Take 1 tablet (40 mg total) by mouth daily. (Patient not taking: Reported on 03/21/2018) 30 tablet 1  . sulfamethoxazole-trimethoprim (BACTRIM DS,SEPTRA DS) 800-160 MG tablet Take 1 tablet by mouth 2 (two) times daily. 4 tablet 0     Discharge Medications: Please see discharge summary for a list of discharge medications.  Relevant Imaging Results:  Relevant Lab Results:   Additional Information [id number]  Montine Circle, LCSW

## 2018-03-21 NOTE — Progress Notes (Addendum)
CSW faxed out FL-2 and PT therapy notes for Long Term care.   CSW spoke with pt and pt's daughter, Elnita MaxwellCheryl. Pt and pt's daughter confirmed that pt is look for long term care and [facility] CSW explained the procedure for placement from the ED and that pt will be given the option to go to the facilities that accept her. CSW explained that [facility] may not accept her or do so quickly enough. Pt and pt's daughter are understanding of that.   Plan: CSW will continue to assist with placement. CSW updated medical team.   Montine CircleKelsy Carolie Mcilrath, Silverio LayLCSWA [facility]  [phone]

## 2018-03-21 NOTE — ED Notes (Signed)
Family at bedside. 

## 2018-03-21 NOTE — ED Notes (Signed)
Placed purewick on pt, tolerated well. 

## 2018-03-21 NOTE — NC FL2 (Deleted)
[place] MEDICAID FL2 LEVEL OF CARE SCREENING TOOL     IDENTIFICATION  Patient Name: Traci Meyer Birthdate: December 28, 1931 Sex: female Admission Date (Current Location): 03/21/2018  [place]:  [place] [id number] K Facility and Address:  [facility], [address], [place], [place] [address]      Provider Number: [id number]  Attending Physician Name and Address:  Default, Provider, MD  Relative Name and Phone Number:  Alfonzo FellerCheryl Parker [phone]    Current Level of Care: Home Recommended Level of Care: Nursing Facility Prior Approval Number:    Date Approved/Denied: 08/26/17 PASRR Number: [id number]  Discharge Plan: SNF(Long term care)    Current Diagnoses: Patient Active Problem List   Diagnosis Date Noted  . Right wrist drop 08/27/2017  . Anasarca 08/25/2017  . Chronic diastolic CHF (congestive heart failure) (HCC) 08/25/2017  . Hypokalemia 08/25/2017  . Chest pain 08/01/2017  . Acute on chronic diastolic heart failure (HCC) 08/01/2017  . Arthritis 08/01/2017  . Hypertension 08/01/2017  . Aortic atherosclerosis (HCC) 08/01/2017    Orientation RESPIRATION BLADDER Height & Weight     Self, Time, Situation, Place  Normal Incontinent Weight: 200 lb (90.7 kg) Height:  5\' 3"  (160 cm)  BEHAVIORAL SYMPTOMS/MOOD NEUROLOGICAL BOWEL NUTRITION STATUS      Incontinent (See AVS )  AMBULATORY STATUS COMMUNICATION OF NEEDS Skin   Total Care(Bed/ wheelchair bound)   Normal                       Personal Care Assistance Level of Assistance  Bathing, Feeding, Dressing, Total care Bathing Assistance: Maximum assistance Feeding assistance: Limited assistance Dressing Assistance: Limited assistance Total Care Assistance: Limited assistance   Functional Limitations Info             SPECIAL CARE FACTORS FREQUENCY  PT (By licensed PT), OT (By licensed OT)     PT Frequency: 5 OT Frequency: 5             Contractures      Additional Factors Info  Code Status, Allergies Code Status Info: Prior Allergies Info: Penicillins           Current Medications (03/21/2018):  This is the current hospital active medication list Current Facility-Administered Medications  Medication Dose Route Frequency Provider Last Rate Last Dose  . sulfamethoxazole-trimethoprim (BACTRIM DS,SEPTRA DS) 800-160 MG per tablet 1 tablet  1 tablet Oral Once Rise MuLeaphart, Kenneth T, PA-C       Current Outpatient Medications  Medication Sig Dispense Refill  . acetaminophen (TYLENOL) 500 MG tablet Take 1,000 mg by mouth every 6 (six) hours as needed for mild pain.    . diphenhydrAMINE (BENADRYL) 25 mg capsule Take 50 mg by mouth every 6 (six) hours as needed for itching or sleep.    . enalapril (VASOTEC) 20 MG tablet Take 20 mg by mouth daily.     Marland Kitchen. ibuprofen (ADVIL,MOTRIN) 200 MG tablet Take 400 mg by mouth every 6 (six) hours as needed for headache or mild pain.    Drema Balzarine. IBUPROFEN-DIPHENHYDRAMINE HCL PO Take 2 tablets by mouth as needed (sleep).    Marland Kitchen. levothyroxine (SYNTHROID, LEVOTHROID) 75 MCG tablet Take 75 mcg by mouth daily before breakfast.    . meloxicam (MOBIC) 15 MG tablet Take 15 mg by mouth daily.    . metoprolol succinate (TOPROL-XL) 25 MG 24 hr tablet Take 25 mg by mouth daily.    Marland Kitchen. oxybutynin (DITROPAN) 5 MG tablet Take 5  mg by mouth daily.    . rosuvastatin (CRESTOR) 10 MG tablet Take 10 mg by mouth daily.    . traMADol (ULTRAM) 50 MG tablet Take 1 tablet (50 mg total) by mouth every 6 (six) hours as needed. 15 tablet 0  . furosemide (LASIX) 20 MG tablet Take 1 tablet (20 mg total) by mouth daily. (Patient not taking: Reported on 02/03/2018) 30 tablet 0  . furosemide (LASIX) 40 MG tablet Take 1 tablet (40 mg total) by mouth daily. (Patient not taking: Reported on 03/21/2018) 30 tablet 1  . sulfamethoxazole-trimethoprim (BACTRIM DS,SEPTRA DS) 800-160 MG tablet Take 1 tablet by mouth 2 (two) times daily. 4 tablet 0      Discharge Medications: Please see discharge summary for a list of discharge medications.  Relevant Imaging Results:  Relevant Lab Results:   Additional Information [id number]  Montine Circle, LCSW

## 2018-03-21 NOTE — Clinical Social Work Note (Signed)
Clinical Social Work Assessment  Patient Details  Name: Traci Meyer MRN: [id number] Date of Birth: 04/16/1931  Date of referral:  03/21/18               Reason for consult:  Facility Placement                Permission sought to share information with:  Family Supports Permission granted to share information::  Yes, Verbal Permission Granted  Name::     Elnita MaxwellCheryl  Agency[facility]  Relationship::  Daughter  Contact Information:  [phone]  Housing/Transportation Living arrangements for the past 2 months:  Single Family Home Source of Information:  Patient Patient Interpreter Needed:  None Criminal Activity/Legal Involvement Pertinent to Current Situation/Hospitalization:  No - Comment as needed Significant Relationships:  Adult Children Lives with:  Adult Children Do you feel safe going back to the place where you live?  No Need for family participation in patient care:     Care giving concerns:  CSW spoke with pt at bedside. Pt was living at home with her daughter prior to admission. Pt's daughter would prefer her mom go to a long term facility.   Social Worker assessment / plan:  CSW spoke with pt at bedside in the ED. Pt has been in the ED recently and was sent home (lives with daughter, Elnita MaxwellCheryl) [facility]. However, HH would not go into the home due to the conditions of the home therefore, pt did not get any therapies following d/c last admission. Pt is again in the ED with the same situation. HH will not go in pt's home therefore medical providers were inquiring about options for pt given her living conditions at home. In addition, pt's home has seven stairs she would have to climb.  Per ED PA pt is alert and orriented.P t is agreeable to long term placement at this time.  [facility] and would like to go there if they have long term beds--CSW will follow up. Pt is agreeable to a broad f/o in order to find a long term bed. Pt ask  that CSW contact Elnita MaxwellCheryl to update--Cheryl is agreeable, and states "that's what ive been trying to do, get her into a long term facility." CSW will follow up with pt once bed availability is determined.   Employment status:  Retired Health and safety inspectornsurance information:  Medicare PT Recommendations:  Not assessed at this time Information / Referral to community resources:  Skilled Nursing Facility  Patient/Family's Response to care:  Pt verbalized understanding of CSW role and expressed appreciation for support. Pt denies any concern regarding pt care at this time.   Patient/Family's Understanding of and Emotional Response to Diagnosis, Current Treatment, and Prognosis:  Pt understanding and realistic regarding physical limitations and safety of the home. Pt understands the need for long term SNF placement-- pt agreeable to long term SNF placement at d/c, at this time. Pt's responses emotionally appropriate during conversation with CSW. Pt denies any concern regarding treatment plan at this time. CSW will continue to provide support and facilitate d/c needs.   Emotional Assessment Appearance:  Appears stated age Attitude/Demeanor/Rapport:  (Patient was appropriate.) Affect (typically observed):  Accepting, Appropriate, Calm Orientation:  Oriented to Self, Oriented to Place, Oriented to  Time, Oriented to Situation Alcohol / Substance use:  Not Applicable Psych involvement (Current and /or in the community):  No (Comment)  Discharge Needs  Concerns to be addressed:  Basic Needs, Care Coordination Readmission within the last 30 days:  No Current discharge risk:  Dependent with Mobility Barriers to Discharge:  Unsafe home situation   Karyss Frese A Lilybeth Vien, LCSW 03/21/2018, 2:39 PM

## 2018-03-22 DIAGNOSIS — M25561 Pain in right knee: Secondary | ICD-10-CM | POA: Diagnosis not present

## 2018-03-22 LAB — URINE CULTURE

## 2018-03-22 NOTE — ED Notes (Signed)
PTAR called for transport.  

## 2018-03-22 NOTE — ED Notes (Signed)
Pt ate small amt breakfast watching wATCHING tv, placed on bedpan

## 2018-03-22 NOTE — ED Notes (Signed)
Traci Meyer was placed back on patient.

## 2018-03-22 NOTE — Progress Notes (Addendum)
9:44am- CSW has reached back out to [place] [facility]. CSW attempted to reach out to pt's daughter Elnita Maxwell( Cheryl) to update her on status of placement at this time. No answer.   CSW spoke with pt at bedside. CSW updated pt on [facility] accepting pt. CSW informed pt that CSW would follow up with [facility] and then follow up with pt afterwards. CSW has spoken with Velna HatchetSheila form [facility] and was informed that she is looking at pt and will call CSW back.    Claude MangesKierra S. Lamario Mani, MSW, LCSW-A Emergency Department Clinical Social Worker [phone]

## 2018-03-22 NOTE — ED Notes (Signed)
Regular Diet was ordered for Lunch. 

## 2018-03-22 NOTE — ED Notes (Signed)
pts daughter here given # to [facility] so she can fill out forms, she called and left mnessage

## 2018-03-22 NOTE — Progress Notes (Signed)
CS[facility] has spoken with [facility] and confirmed that they are able to take pt today. CSW has updated RN, MD and family of transport at this time. RN to call and set up PTAR for pt. RN to call report to [phone] [facility].  There are no further CSW needs at this time. CSW signing off.    Claude MangesKierra S. Haneef Hallquist, MSW, LCSW-A Emergency Department Clinical Social Worker [phone]

## 2018-03-22 NOTE — ED Notes (Signed)
SW into see pt and states will call when pt is ready  To leave

## 2018-03-22 NOTE — Progress Notes (Addendum)
11:04am- CSW receive call from [facility] and was informed that they able to take pt they just need for daughter to complete paperwork. Velna [facility]Sheila from [facility] to follow up with CSW on a time to send pt to the facility.   CSW spoke with pt's daughter and updated her on the status of placing pt at this time. CSW is still waiting to hear from Velna [facility]Sheila [facility] about bed offer.   Claude MangesKierra S. Maurianna Benard, MSW, LCSW-A Emergency Department Clinical Social Worker [phone]

## 2018-03-22 NOTE — ED Notes (Signed)
Patient was given a cup of water and cookies.

## 2018-03-22 NOTE — ED Notes (Signed)
Ptar here  ?

## 2018-05-19 IMAGING — CR DG KNEE COMPLETE 4+V*R*
7 series · 7 of 7 positions shown · non-contrast
Comparison: None

CLINICAL DATA: Witnessed fall today, fell out of wheelchair, our
rotation of RIGHT leg, RIGHT hip and RIGHT knee pain rated at [DATE]

EXAM:
RIGHT KNEE - COMPLETE 4+ VIEW

[w knee lat right]
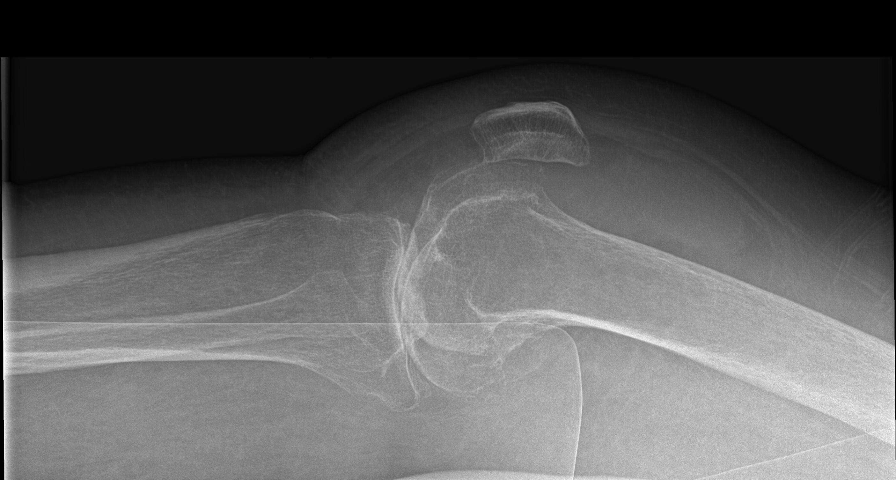

[x knee ap right (1 of 6)]
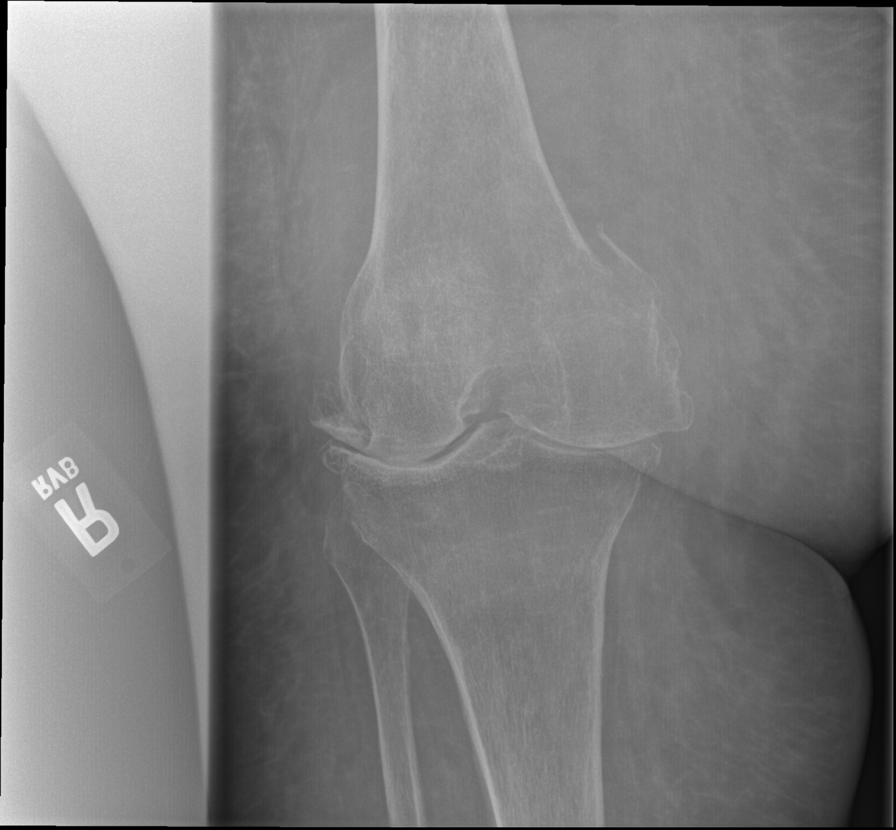

[x knee ap right (2 of 6)]
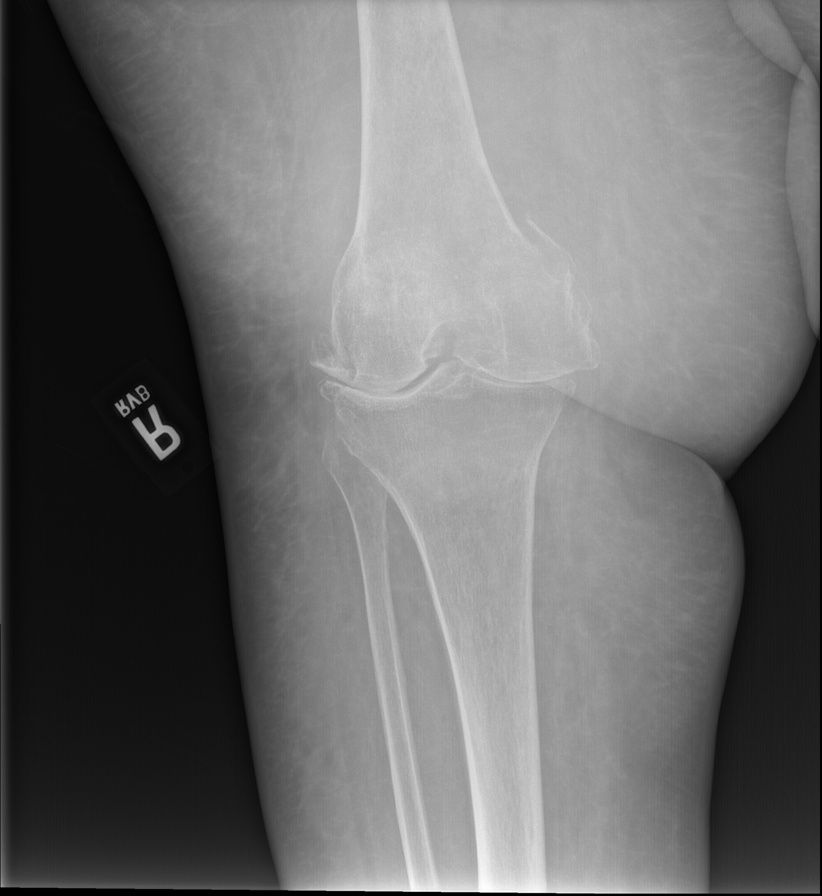

[x knee ap right (3 of 6)]
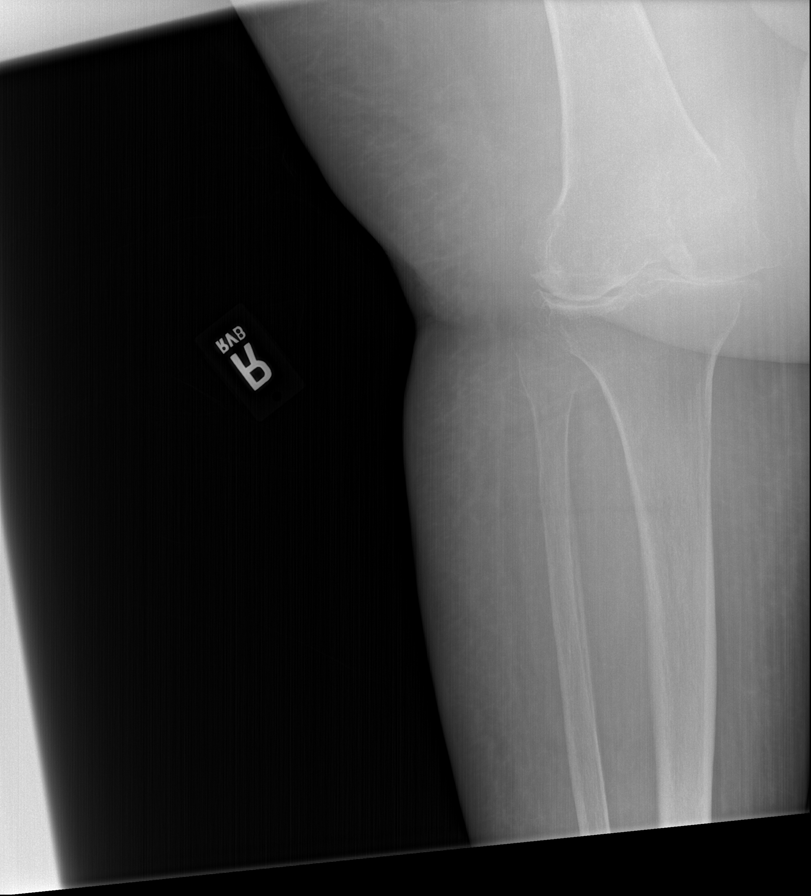

[x knee ap right (4 of 6)]
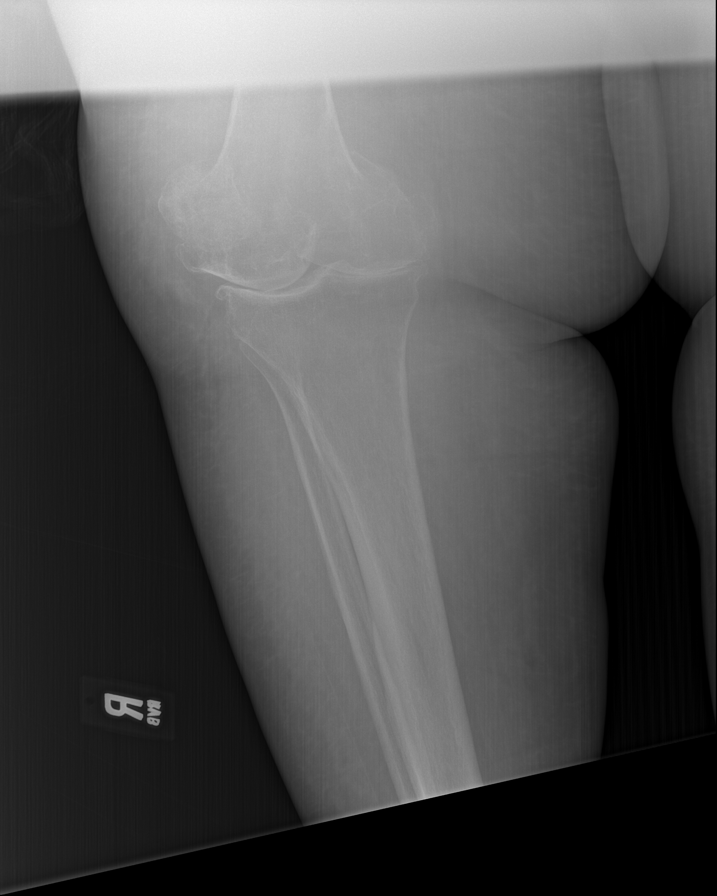

[x knee ap right (5 of 6)]
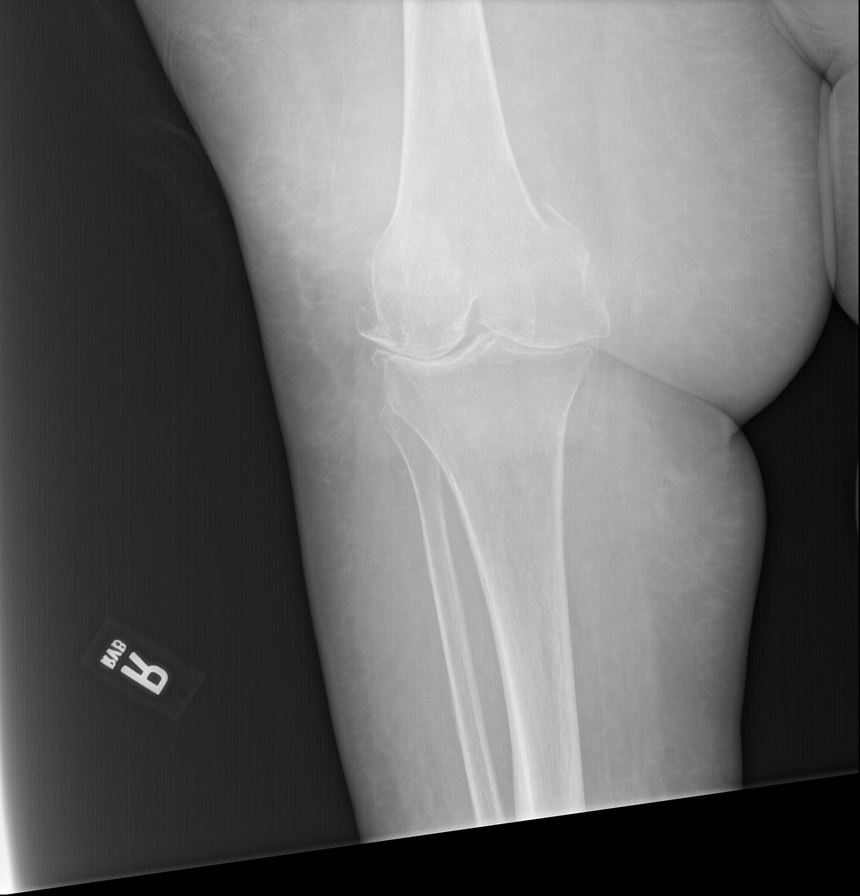

[x knee ap right (6 of 6)]
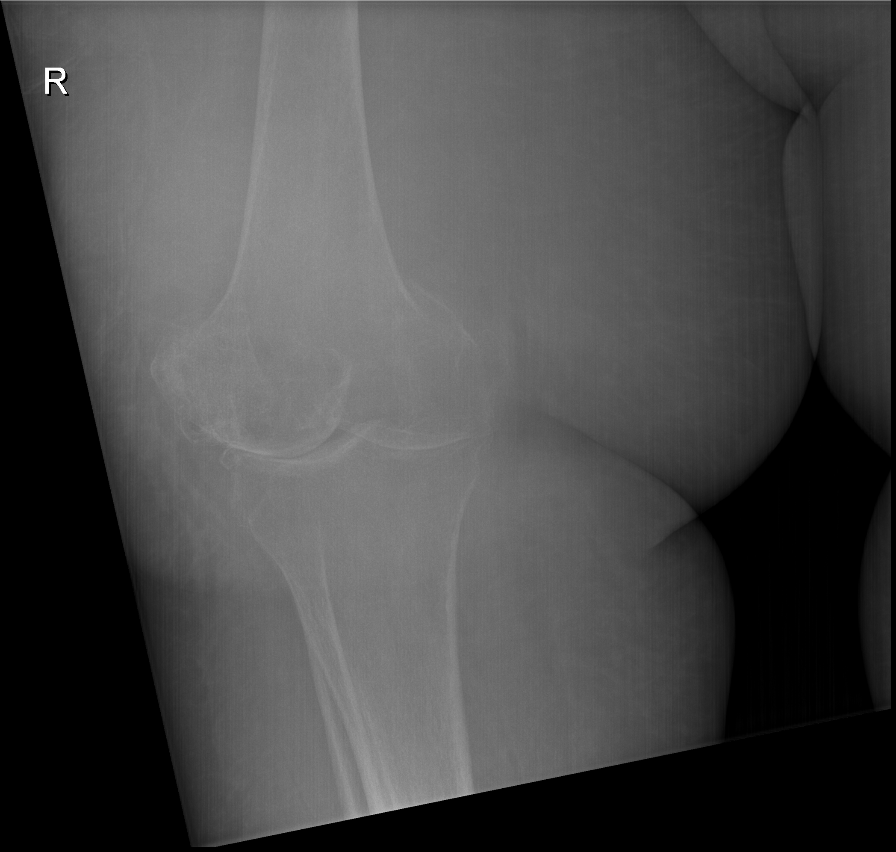

[7 of 7 positions shown; findings below may reference images not displayed]

FINDINGS: Diffuse osseous demineralization.

Marked joint space narrowing and spur formation tricompartmental.

Displaced fracture at medial distal RIGHT femoral metaphysis.

Associated large knee joint effusion.

No additional fracture, dislocation, or bone destruction.
IMPRESSION: Mildly displaced fracture of the medial distal RIGHT femoral
metaphysis with associated large knee joint effusion.

Osseous demineralization with advanced tricompartmental
osteoarthritic changes.

## 2023-06-18 ENCOUNTER — Other Ambulatory Visit: Payer: Self-pay

## 2023-06-18 ENCOUNTER — Inpatient Hospital Stay (HOSPITAL_COMMUNITY)
Admission: EM | Admit: 2023-06-18 | Discharge: 2023-06-22 | DRG: 291 | Disposition: A | Discharge status: Home Health | Payer: 59 | Attending: Internal Medicine | Admitting: Internal Medicine

## 2023-06-18 DIAGNOSIS — I509 Heart failure, unspecified: Principal | ICD-10-CM

## 2023-06-18 DIAGNOSIS — I700 Atherosclerosis of aorta: Secondary | ICD-10-CM | POA: Diagnosis present

## 2023-06-18 DIAGNOSIS — I272 Pulmonary hypertension, unspecified: Secondary | ICD-10-CM | POA: Diagnosis present

## 2023-06-18 DIAGNOSIS — Z5982 Transportation insecurity: Secondary | ICD-10-CM

## 2023-06-18 DIAGNOSIS — Z87891 Personal history of nicotine dependence: Secondary | ICD-10-CM

## 2023-06-18 DIAGNOSIS — I10 Essential (primary) hypertension: Secondary | ICD-10-CM | POA: Diagnosis present

## 2023-06-18 DIAGNOSIS — Z79899 Other long term (current) drug therapy: Secondary | ICD-10-CM

## 2023-06-18 DIAGNOSIS — Z79890 Hormone replacement therapy (postmenopausal): Secondary | ICD-10-CM

## 2023-06-18 DIAGNOSIS — E876 Hypokalemia: Secondary | ICD-10-CM | POA: Diagnosis present

## 2023-06-18 DIAGNOSIS — D649 Anemia, unspecified: Secondary | ICD-10-CM | POA: Diagnosis present

## 2023-06-18 DIAGNOSIS — E8809 Other disorders of plasma-protein metabolism, not elsewhere classified: Secondary | ICD-10-CM | POA: Diagnosis present

## 2023-06-18 DIAGNOSIS — Z993 Dependence on wheelchair: Secondary | ICD-10-CM

## 2023-06-18 DIAGNOSIS — I4430 Unspecified atrioventricular block: Secondary | ICD-10-CM | POA: Diagnosis present

## 2023-06-18 DIAGNOSIS — I5033 Acute on chronic diastolic (congestive) heart failure: Secondary | ICD-10-CM | POA: Diagnosis present

## 2023-06-18 DIAGNOSIS — I110 Hypertensive heart disease with heart failure: Secondary | ICD-10-CM | POA: Diagnosis not present

## 2023-06-18 DIAGNOSIS — Z6841 Body Mass Index (BMI) 40.0 and over, adult: Secondary | ICD-10-CM

## 2023-06-18 DIAGNOSIS — Z880 Allergy status to penicillin: Secondary | ICD-10-CM

## 2023-06-18 DIAGNOSIS — E039 Hypothyroidism, unspecified: Secondary | ICD-10-CM | POA: Diagnosis present

## 2023-06-18 DIAGNOSIS — Z791 Long term (current) use of non-steroidal anti-inflammatories (NSAID): Secondary | ICD-10-CM

## 2023-06-18 DIAGNOSIS — E785 Hyperlipidemia, unspecified: Secondary | ICD-10-CM | POA: Diagnosis present

## 2023-06-18 DIAGNOSIS — E66813 Obesity, class 3: Secondary | ICD-10-CM | POA: Diagnosis present

## 2023-06-18 HISTORY — DX: Unspecified diastolic (congestive) heart failure: I50.30

## 2023-06-18 HISTORY — DX: Hypothyroidism, unspecified: E03.9

## 2023-06-18 NOTE — ED Triage Notes (Signed)
Pt BIB [facility] from home. CC: BLE edema, per pt/family Unable to get meds x3wks, takes lasix. Has been taking daughters instead.  Denies SOB.   EMS VS: 138/80, HR: 78, 95% roomair, cbg 98

## 2023-06-19 ENCOUNTER — Emergency Department (HOSPITAL_COMMUNITY): Payer: 59

## 2023-06-19 ENCOUNTER — Encounter (HOSPITAL_COMMUNITY): Payer: Self-pay | Admitting: Internal Medicine

## 2023-06-19 DIAGNOSIS — Z79899 Other long term (current) drug therapy: Secondary | ICD-10-CM | POA: Diagnosis not present

## 2023-06-19 DIAGNOSIS — Z87891 Personal history of nicotine dependence: Secondary | ICD-10-CM | POA: Diagnosis not present

## 2023-06-19 DIAGNOSIS — Z880 Allergy status to penicillin: Secondary | ICD-10-CM | POA: Diagnosis not present

## 2023-06-19 DIAGNOSIS — E039 Hypothyroidism, unspecified: Secondary | ICD-10-CM | POA: Diagnosis present

## 2023-06-19 DIAGNOSIS — Z993 Dependence on wheelchair: Secondary | ICD-10-CM | POA: Diagnosis not present

## 2023-06-19 DIAGNOSIS — I5033 Acute on chronic diastolic (congestive) heart failure: Secondary | ICD-10-CM | POA: Diagnosis present

## 2023-06-19 DIAGNOSIS — I10 Essential (primary) hypertension: Secondary | ICD-10-CM | POA: Diagnosis not present

## 2023-06-19 DIAGNOSIS — Z79890 Hormone replacement therapy (postmenopausal): Secondary | ICD-10-CM | POA: Diagnosis not present

## 2023-06-19 DIAGNOSIS — I272 Pulmonary hypertension, unspecified: Secondary | ICD-10-CM | POA: Diagnosis present

## 2023-06-19 DIAGNOSIS — Z6841 Body Mass Index (BMI) 40.0 and over, adult: Secondary | ICD-10-CM | POA: Diagnosis not present

## 2023-06-19 DIAGNOSIS — E8809 Other disorders of plasma-protein metabolism, not elsewhere classified: Secondary | ICD-10-CM | POA: Diagnosis present

## 2023-06-19 DIAGNOSIS — I110 Hypertensive heart disease with heart failure: Secondary | ICD-10-CM | POA: Diagnosis present

## 2023-06-19 DIAGNOSIS — I700 Atherosclerosis of aorta: Secondary | ICD-10-CM | POA: Diagnosis present

## 2023-06-19 DIAGNOSIS — Z5982 Transportation insecurity: Secondary | ICD-10-CM | POA: Diagnosis not present

## 2023-06-19 DIAGNOSIS — E785 Hyperlipidemia, unspecified: Secondary | ICD-10-CM | POA: Diagnosis present

## 2023-06-19 DIAGNOSIS — I4430 Unspecified atrioventricular block: Secondary | ICD-10-CM | POA: Diagnosis present

## 2023-06-19 DIAGNOSIS — I5031 Acute diastolic (congestive) heart failure: Secondary | ICD-10-CM | POA: Diagnosis not present

## 2023-06-19 DIAGNOSIS — D649 Anemia, unspecified: Secondary | ICD-10-CM | POA: Diagnosis present

## 2023-06-19 DIAGNOSIS — E876 Hypokalemia: Secondary | ICD-10-CM | POA: Diagnosis present

## 2023-06-19 DIAGNOSIS — E66813 Obesity, class 3: Secondary | ICD-10-CM | POA: Diagnosis present

## 2023-06-19 DIAGNOSIS — Z791 Long term (current) use of non-steroidal anti-inflammatories (NSAID): Secondary | ICD-10-CM | POA: Diagnosis not present

## 2023-06-19 DIAGNOSIS — I509 Heart failure, unspecified: Secondary | ICD-10-CM | POA: Insufficient documentation

## 2023-06-19 LAB — CBC
HCT: 31.4 % — ABNORMAL LOW (ref 36.0–46.0)
Hemoglobin: 10.3 g/dL — ABNORMAL LOW (ref 12.0–15.0)
MCH: 32.1 pg (ref 26.0–34.0)
MCHC: 32.8 g/dL (ref 30.0–36.0)
MCV: 97.8 fL (ref 80.0–100.0)
Platelets: 316 10*3/uL (ref 150–400)
RBC: 3.21 MIL/uL — ABNORMAL LOW (ref 3.87–5.11)
RDW: 15.7 % — ABNORMAL HIGH (ref 11.5–15.5)
WBC: 5.7 10*3/uL (ref 4.0–10.5)
nRBC: 0 % (ref 0.0–0.2)

## 2023-06-19 LAB — COMPREHENSIVE METABOLIC PANEL
ALT: 27 U/L (ref 0–44)
AST: 28 U/L (ref 15–41)
Albumin: 3 g/dL — ABNORMAL LOW (ref 3.5–5.0)
Alkaline Phosphatase: 94 U/L (ref 38–126)
Anion gap: 10 (ref 5–15)
BUN: 14 mg/dL (ref 8–23)
CO2: 25 mmol/L (ref 22–32)
Calcium: 8.8 mg/dL — ABNORMAL LOW (ref 8.9–10.3)
Chloride: 101 mmol/L (ref 98–111)
Creatinine, Ser: 0.82 mg/dL (ref 0.44–1.00)
GFR, Estimated: >60 mL/min (ref >60)
Glucose, Bld: 85 mg/dL (ref 70–99)
Potassium: 3.3 mmol/L — ABNORMAL LOW (ref 3.5–5.1)
Sodium: 136 mmol/L (ref 135–145)
Total Bilirubin: 0.7 mg/dL (ref 0.3–1.2)
Total Protein: 6.3 g/dL — ABNORMAL LOW (ref 6.5–8.1)

## 2023-06-19 LAB — T4, FREE: Free T4: 0.61 ng/dL (ref 0.61–1.12)

## 2023-06-19 LAB — BRAIN NATRIURETIC PEPTIDE: B Natriuretic Peptide: 76.5 pg/mL (ref 0.0–100.0)

## 2023-06-19 LAB — TSH: TSH: 21.466 u[IU]/mL — ABNORMAL HIGH (ref 0.350–4.500)

## 2023-06-19 MED ORDER — SODIUM CHLORIDE 0.9% FLUSH
3.0000 mL | Freq: Two times a day (BID) | INTRAVENOUS | Status: DC
  Administered 2023-06-19 – 2023-06-22 (×7): 3 mL via INTRAVENOUS

## 2023-06-19 MED ORDER — FUROSEMIDE 10 MG/ML IJ SOLN
20.0000 mg | Freq: Two times a day (BID) | INTRAMUSCULAR | Status: DC
  Administered 2023-06-19 – 2023-06-22 (×7): 20 mg via INTRAVENOUS
  Filled 2023-06-19 (×7): qty 2

## 2023-06-19 MED ORDER — SODIUM CHLORIDE 0.9% FLUSH: 3.0000 mL | INTRAVENOUS | Status: DC | PRN

## 2023-06-19 MED ORDER — ENOXAPARIN SODIUM 40 MG/0.4ML IJ SOSY
40.0000 mg | PREFILLED_SYRINGE | INTRAMUSCULAR | Status: DC
  Administered 2023-06-19 – 2023-06-21 (×3): 40 mg via SUBCUTANEOUS
  Filled 2023-06-19 (×3): qty 0.4

## 2023-06-19 MED ORDER — ONDANSETRON HCL 4 MG/2ML IJ SOLN: 4.0000 mg | Freq: Four times a day (QID) | INTRAMUSCULAR | Status: DC | PRN

## 2023-06-19 MED ORDER — NYSTATIN 100000 UNIT/GM EX POWD
Freq: Two times a day (BID) | CUTANEOUS | Status: DC
  Filled 2023-06-19: qty 15

## 2023-06-19 MED ORDER — ACETAMINOPHEN 325 MG PO TABS
650.0000 mg | ORAL_TABLET | ORAL | Status: DC | PRN
  Administered 2023-06-20: 650 mg via ORAL
  Filled 2023-06-19: qty 2

## 2023-06-19 MED ORDER — LEVOTHYROXINE SODIUM 100 MCG PO TABS
100.0000 ug | ORAL_TABLET | Freq: Every day | ORAL | Status: DC
  Administered 2023-06-20 – 2023-06-22 (×3): 100 ug via ORAL
  Filled 2023-06-19 (×3): qty 1

## 2023-06-19 MED ORDER — SODIUM CHLORIDE 0.9 % IV SOLN: 250.0000 mL | INTRAVENOUS | Status: DC | PRN

## 2023-06-19 MED ORDER — POTASSIUM CHLORIDE CRYS ER 20 MEQ PO TBCR
40.0000 meq | EXTENDED_RELEASE_TABLET | ORAL | Status: AC
  Administered 2023-06-19: 40 meq via ORAL
  Filled 2023-06-19: qty 2

## 2023-06-19 MED ORDER — FUROSEMIDE 10 MG/ML IJ SOLN
40.0000 mg | Freq: Once | INTRAMUSCULAR | Status: AC
  Administered 2023-06-19: 40 mg via INTRAVENOUS
  Filled 2023-06-19: qty 4

## 2023-06-19 NOTE — H&P (Signed)
History and Physical    Patient: Traci Meyer KGM:[id number] DOB: 1931-07-06 DOA: 06/18/2023 DOS: the patient was seen and examined on 06/19/2023 PCP: Pcp, No  Patient coming from: Home via EMS  Chief Complaint:  Chief Complaint  Patient presents with   Leg Swelling   HPI: Traci Meyer is a 89 y.o. female with medical history significant of hypertension, hyperlipidemia, diastolic congestive heart failure, hypothyroidism, aortic atherosclerosis, arthritis, and morbid obesity who presents with complaints of lower extremity swelling.  Symptoms have been progressively worsening over the last month.  She reports having significant pain in her lower extremities due to the swelling.  Denies having any recent fever, cough, shortness of breath, nausea, vomiting, abdominal pain, or dysuria symptoms.  At baseline patient is wheelchair-bound and had recently moved to [place] to live with her daughter from [place], [place] about 2 weeks ago after being evicted.  She had been out of her medicines for the last 3 weeks or so.  She tried taking some of her daughter's Lasix medication, but denied having any improvement in her symptoms.    In the emergency department patient was noted to have temperature of up to 100.1 F and all other vital signs otherwise noted to be stable.  Labs significant for hemoglobin 10.4, potassium 3.3, and BNP 76.5. Chest x-ray noted cardiomegaly with pulmonary vascular congestion and interstitial coarsening concerning for CHF exacerbation.  Patient has been given Lasix 40 mg IV.  Review of Systems: As mentioned in the history of present illness. All other systems reviewed and are negative. Past Medical History:  Diagnosis Date   Aortic atherosclerosis (HCC)    Arthritis    Diastolic congestive heart failure (HCC)    Hypertension    Hypothyroidism    Past Surgical History:  Procedure Laterality Date   MULTIPLE TOOTH EXTRACTIONS     Social History:  reports that she has  quit smoking. She has never used smokeless tobacco. She reports that she does not drink alcohol and does not use drugs.  Allergies  Allergen Reactions   Penicillins     Has patient had a PCN reaction causing immediate rash, facial/tongue/throat swelling, SOB or lightheadedness with hypotension: Yes Has patient had a PCN reaction causing severe rash involving mucus membranes or skin necrosis: No Has patient had a PCN reaction that required hospitalization: No Has patient had a PCN reaction occurring within the last 10 years: No If all of the above answers are "NO", then may proceed with Cephalosporin use.     Family History  Problem Relation Age of Onset   Heart disease Neg Hx     Prior to Admission medications   Medication Sig Start Date End Date Taking? Authorizing Provider  acetaminophen (TYLENOL) 500 MG tablet Take 1,000 mg by mouth every 6 (six) hours as needed for mild pain.    [provider]  diphenhydrAMINE (BENADRYL) 25 mg capsule Take 50 mg by mouth every 6 (six) hours as needed for itching or sleep.    [provider]  enalapril (VASOTEC) 20 MG tablet Take 20 mg by mouth daily.     [provider]  furosemide (LASIX) 20 MG tablet Take 1 tablet (20 mg total) by mouth daily. Patient not taking: Reported on 02/03/2018 01/20/18   Arby Barrette, MD  furosemide (LASIX) 40 MG tablet Take 1 tablet (40 mg total) by mouth daily. Patient not taking: Reported on 03/21/2018 08/29/17   Lanelle Bal, MD  ibuprofen (ADVIL,MOTRIN) 200 MG tablet Take 400  mg by mouth every 6 (six) hours as needed for headache or mild pain.    [provider]  IBUPROFEN-DIPHENHYDRAMINE HCL PO Take 2 tablets by mouth as needed (sleep).    [provider]  levothyroxine (SYNTHROID, LEVOTHROID) 75 MCG tablet Take 75 mcg by mouth daily before breakfast.    [provider]  meloxicam (MOBIC) 15 MG tablet Take 15 mg by mouth daily.    [provider]   metoprolol succinate (TOPROL-XL) 25 MG 24 hr tablet Take 25 mg by mouth daily.    [provider]  oxybutynin (DITROPAN) 5 MG tablet Take 5 mg by mouth daily.    [provider]  rosuvastatin (CRESTOR) 10 MG tablet Take 10 mg by mouth daily.    [provider]  sulfamethoxazole-trimethoprim (BACTRIM DS,SEPTRA DS) 800-160 MG tablet Take 1 tablet by mouth 2 (two) times daily. 03/21/18   Rise Mu, PA-C  traMADol (ULTRAM) 50 MG tablet Take 1 tablet (50 mg total) by mouth every 6 (six) hours as needed. 02/03/18   Rolan Bucco, MD    Physical Exam: Vitals:   06/19/23 0410 06/19/23 0415 06/19/23 0543 06/19/23 0558  BP:  (!) 110/49 118/60   Pulse:  74 75   Resp:  17 17   Temp: 100.1 F (37.8 C)  98.3 F (36.8 C)   TempSrc: Oral  Oral   SpO2:  98% 99%   Weight:   103.8 kg 103.5 kg  Height:   5\' 3"  (1.6 m) 5\' 3"  (1.6 m)   Constitutional: Elderly female currently in no acute distress Eyes: PERRL, lids and conjunctivae normal ENMT: Mucous membranes are moist. Posterior pharynx clear of any exudate or lesions.  Neck: normal, supple, JVD present. Respiratory: Normal respiratory effort with crackles appreciated.  O2 saturation currently maintained on room air. Cardiovascular: Regular rate and rhythm, no murmurs / rubs / gallops.  2+ pitting lower edema.  No carotid bruits.  Abdomen: no tenderness, no masses palpated.  Bowel sounds positive.  Musculoskeletal: no clubbing / cyanosis. No joint deformity upper and lower extremities. Good ROM, no contractures. Normal muscle tone.  Skin: no rashes, lesions, ulcers. No induration Neurologic: CN 2-12 grossly intact.  Able to move all extremities.  Psychiatric: Normal judgment and insight. Alert and oriented x 3. Normal mood.   Data Reviewed:  EKG appears to have a lot of background artifact and a rate of 77 bpm.  Reviewed labs, imaging, and pertinent records as documented in this note.  Assessment and  Plan:  Diastolic congestive heart failure exacerbation Acute on chronic.  Patient presents with progressive leg swelling, and malaise after being out of diuretics for the last 3 weeks.  BNP 76.5 which is elevated when compared to prior as patient is obese.  Chest x-ray gave concern for CHF exacerbation.  Last echocardiogram from 2018 noted EF to be 60-65% with grade 2 diastolic dysfunction.  Patient had initially been given Lasix 40 mg IV x 1 dose. -Admit to a cardiac telemetry bed -Heart failure order set utilized -Strict I&O's and daily weights -Check echocardiogram -Lasix 20 mg IV twice daily -Cardiology consulted for heart failure, will follow-up for any further recommendations  Hypothyroidism Unclear if patient has been taking levothyroxine. -Check ZOX(09.604 ) and free T4 -Cardiology recommended resuming levothyroxine at 100 mcg daily -Will need close follow-up with repeat check in 4-6 weeks  Hypokalemia Acute.  Initial potassium noted to be 3.3. -Give potassium chloride 40 mEq -Continue to monitor and replace  as needed  Essential hypertension Blood pressures initially noted to be soft 95/57 after initial IV Lasix given.  Daughter makes note that she was taking amlodipine for blood pressure, but is unsure if she was on metoprolol. -Continue with IV diuresis    Normocytic anemia Chronic.  Hemoglobin 10.3 and appears in the range with prior baseline of 9 to 11 g/dL. -Continue to monitor  Hyperlipidemia It was reported that patient had some issues with some cholesterol medicine that led to her being hospitalized previously.  Morbid obesity BMI 40.42 kg/m  DVT prophylaxis: Lovenox Advance Care Planning:   Code Status: Full Code   Consults: Cardiology  Family Communication: Patient's daughter Traci Meyer updated over the phone.  Severity of Illness: The appropriate patient status for this patient is INPATIENT. Inpatient status is judged to be reasonable and necessary in  order to provide the required intensity of service to ensure the patient's safety. The patient's presenting symptoms, physical exam findings, and initial radiographic and laboratory data in the context of their chronic comorbidities is felt to place them at high risk for further clinical deterioration. Furthermore, it is not anticipated that the patient will be medically stable for discharge from the hospital within 2 midnights of admission.   * I certify that at the point of admission it is my clinical judgment that the patient will require inpatient hospital care spanning beyond 2 midnights from the point of admission due to high intensity of service, high risk for further deterioration and high frequency of surveillance required.*  Author: Clydie Braun, MD 06/19/2023 7:37 AM  For on call review [email or web address].

## 2023-06-19 NOTE — Plan of Care (Signed)

## 2023-06-19 NOTE — ED Provider Notes (Signed)
[facility] Emergency Department Provider Note MRN:  [id number]  Arrival date & time: 06/19/23     Chief Complaint   Leg Swelling   History of Present Illness   Traci Meyer is a 89 y.o. year-old female with a history of hypertension presenting to the ED with chief complaint of leg swelling.  Worsening leg swelling over the past several days.  Ran out of home Lasix, trouble getting the meds for the past 3 weeks.  Endorses some malaise, shortness of breath when laying flat.  Denies chest pain.  Baseline not ambulatory.  Review of Systems  A thorough review of systems was obtained and all systems are negative except as noted in the HPI and PMH.   Patient's Health History    Past Medical History:  Diagnosis Date   Aortic atherosclerosis (HCC)    Arthritis    Hypertension     Past Surgical History:  Procedure Laterality Date   MULTIPLE TOOTH EXTRACTIONS      Family History  Problem Relation Age of Onset   Heart disease Neg Hx     Social History   Socioeconomic History   Marital status: Single    Spouse name: Not on file   Number of children: Not on file   Years of education: Not on file   Highest education level: Not on file  Occupational History   Not on file  Tobacco Use   Smoking status: Former   Smokeless tobacco: Never   Tobacco comments:    45 years ago briefly  Vaping Use   Vaping Use: Never used  Substance and Sexual Activity   Alcohol use: No   Drug use: No   Sexual activity: Not on file  Other Topics Concern   Not on file  Social History Narrative   Not on file   Social Determinants of Health   Financial Resource Strain: Not on file  Food Insecurity: No Food Insecurity (06/19/2023)   Hunger Vital Sign    Worried About Running Out of Food in the Last Year: Never true    Ran Out of Food in the Last Year: Never true  Transportation Needs: Unmet Transportation Needs (06/19/2023)   PRAPARE - Scientist, research (physical sciences) (Medical): Yes    Lack of Transportation (Non-Medical): No  Physical Activity: Not on file  Stress: Not on file  Social Connections: Not on file  Intimate Partner Violence: Not At Risk (06/19/2023)   Humiliation, Afraid, Rape, and Kick questionnaire    Fear of Current or Ex-Partner: No    Emotionally Abused: No    Physically Abused: No    Sexually Abused: No     Physical Exam   Vitals:   06/19/23 0415 06/19/23 0543  BP: (!) 110/49 118/60  Pulse: 74 75  Resp: 17 17  Temp:  98.3 F (36.8 C)  SpO2: 98% 99%    CONSTITUTIONAL: Well-appearing, NAD NEURO/PSYCH:  Alert and oriented x 3, no focal deficits EYES:  eyes equal and reactive ENT/NECK:  no LAD, no JVD CARDIO: Regular rate, well-perfused, normal S1 and S2 PULM:  CTAB no wheezing or rhonchi GI/GU:  non-distended, non-tender MSK/SPINE:  No gross deformities, pitting edema to bilateral lower extremities SKIN:  no rash, atraumatic   *Additional and/or pertinent findings included in MDM below  Diagnostic and Interventional Summary    EKG Interpretation  Date/Time:  Friday June 18 2023 23:27:00 EDT Ventricular Rate:  77 PR Interval:    QRS  Duration: 101 QT Interval:  383 QTC Calculation: 434 R Axis:   -16 Text Interpretation: Sinus rhythm ventricular premature complex Borderline left axis deviation Low voltage, precordial leads Probable anteroseptal infarct, old Confirmed by Kennis Carina [phone]) on 06/18/2023 11:45:29 PM       Labs Reviewed  CBC - Abnormal; Notable for the following components:      Result Value   RBC 3.21 (*)    Hemoglobin 10.3 (*)    HCT 31.4 (*)    RDW 15.7 (*)    All other components within normal limits  COMPREHENSIVE METABOLIC PANEL - Abnormal; Notable for the following components:   Potassium 3.3 (*)    Calcium 8.8 (*)    Total Protein 6.3 (*)    Albumin 3.0 (*)    All other components within normal limits  BRAIN NATRIURETIC PEPTIDE    DG Chest Port 1 View  Final  Result      Medications  furosemide (LASIX) injection 40 mg (40 mg Intravenous Given 06/19/23 0044)     Procedures  /  Critical Care Procedures  ED Course and Medical Decision Making  Initial Impression and Ddx With the pitting edema on exam and the question of mild orthopnea, all occurring in the absence of Lasix at home.  Seems suspicious for CHF exacerbation.  Has not had an echo in several years.  Awaiting labs, may need admission for diuresis.  Past medical/surgical history that increases complexity of ED encounter:  chf  Interpretation of Diagnostics I personally reviewed the EKG and my interpretation is as follows:  SR  Labs overall reassuring with no significant blood count or electrolyte disturbance.  Chest x-ray revealing pulmonary edema  Patient Reassessment and Ultimate Disposition/Management     Admitted to medicine  Patient management required discussion with the following services or consulting groups:  None  Complexity of Problems Addressed Acute illness or injury that poses threat of life of bodily function  Additional Data Reviewed and Analyzed Further history obtained from: Further history from spouse/family member  Additional Factors Impacting ED Encounter Risk Consideration of hospitalization  Elmer Sow. Pilar Plate, MD [facility] Emergency Medicine [facility] [email or web address]  Final Clinical Impressions(s) / ED Diagnoses     ICD-10-CM   1. Acute on chronic congestive heart failure, unspecified heart failure type (HCC)  I50.9       ED Discharge Orders     None        Discharge Instructions Discussed with and Provided to Patient:   Discharge Instructions   None      Sabas Sous, MD 06/19/23 [phone]

## 2023-06-19 NOTE — Consult Note (Signed)
Cardiology Consultation   Patient ID: Traci Meyer MRN: [id number]; DOB: [birth year]  Admit date: 06/18/2023 Date of Consult: 06/19/2023  PCP:  Oneita Hurt, No   [facility] Providers Cardiologist:  None        Patient Profile:   Traci Meyer is a 89 y.o. female with a hx of hypertension, hyperlipidemia, HFpEF, hypothyroidism, aortic atherosclerosis, arthritis, obesity who is being seen 06/19/2023 for the evaluation of acute CHF at the request of Dr. Katrinka Blazing.  History of Present Illness:   [name] presented to the ED with worsening lower extremity edema and pain. Patient recently moved to [facility] from [place] and has been out of cardiac medications for several weeks. She apparently took some of her daughter's Lasix without improvement in symptoms. Initial labs notable for HGB 10.3, BNP 76.5, K 3.3, TSH 21.466.  Patient is not fully clear on her medical history, not aware of hypothyroidism or PTA medications. She is not very active at baseline but says that she can walk around the grocery store without dyspnea. Also denies orthopnea but does say that she sometimes sleeps on 2 pillows at night, says it helps her breathing. Denies history of atrial fibrillation, has never taken a blood thinner. She does endorse a history of hypertension. Regarding leg pain, patient says that legs have hurt chronically for years but have been worse with swelling over the last month. She clarifies that she didn't have acute exacerbation prompting ED visit, rather hadn't seen improvement.  Patient denies use of tobacco but does occasionally drink.   Past Medical History:  Diagnosis Date   Aortic atherosclerosis (HCC)    Arthritis    Diastolic congestive heart failure (HCC)    Hypertension    Hypothyroidism     Past Surgical History:  Procedure Laterality Date   MULTIPLE TOOTH EXTRACTIONS       Home Medications:  Prior to Admission medications   Medication Sig Start Date End Date  Taking? Authorizing Provider  acetaminophen (TYLENOL) 500 MG tablet Take 1,000 mg by mouth every 6 (six) hours as needed for mild pain.    [provider]  diphenhydrAMINE (BENADRYL) 25 mg capsule Take 50 mg by mouth every 6 (six) hours as needed for itching or sleep.    [provider]  enalapril (VASOTEC) 20 MG tablet Take 20 mg by mouth daily.     [provider]  furosemide (LASIX) 20 MG tablet Take 1 tablet (20 mg total) by mouth daily. Patient not taking: Reported on 02/03/2018 01/20/18   Arby Barrette, MD  furosemide (LASIX) 40 MG tablet Take 1 tablet (40 mg total) by mouth daily. Patient not taking: Reported on 03/21/2018 08/29/17   Lanelle Bal, MD  ibuprofen (ADVIL,MOTRIN) 200 MG tablet Take 400 mg by mouth every 6 (six) hours as needed for headache or mild pain.    [provider]  IBUPROFEN-DIPHENHYDRAMINE HCL PO Take 2 tablets by mouth as needed (sleep).    [provider]  levothyroxine (SYNTHROID, LEVOTHROID) 75 MCG tablet Take 75 mcg by mouth daily before breakfast.    [provider]  meloxicam (MOBIC) 15 MG tablet Take 15 mg by mouth daily.    [provider]  metoprolol succinate (TOPROL-XL) 25 MG 24 hr tablet Take 25 mg by mouth daily.    [provider]  oxybutynin (DITROPAN) 5 MG tablet Take 5 mg by mouth daily.    [provider]  rosuvastatin (CRESTOR) 10 MG tablet Take 10 mg by  mouth daily.    [provider]  sulfamethoxazole-trimethoprim (BACTRIM DS,SEPTRA DS) 800-160 MG tablet Take 1 tablet by mouth 2 (two) times daily. 03/21/18   Rise Mu, PA-C  traMADol (ULTRAM) 50 MG tablet Take 1 tablet (50 mg total) by mouth every 6 (six) hours as needed. 02/03/18   Rolan Bucco, MD    Inpatient Medications: Scheduled Meds:  enoxaparin (LOVENOX) injection  40 mg Subcutaneous Q24H   furosemide  20 mg Intravenous BID   sodium chloride flush  3 mL Intravenous Q12H   Continuous  Infusions:  sodium chloride     PRN Meds: sodium chloride, acetaminophen, ondansetron (ZOFRAN) IV, sodium chloride flush  Allergies:    Allergies  Allergen Reactions   Penicillins     Has patient had a PCN reaction causing immediate rash, facial/tongue/throat swelling, SOB or lightheadedness with hypotension: Yes Has patient had a PCN reaction causing severe rash involving mucus membranes or skin necrosis: No Has patient had a PCN reaction that required hospitalization: No Has patient had a PCN reaction occurring within the last 10 years: No If all of the above answers are "NO", then may proceed with Cephalosporin use.     Social History:   Social History   Socioeconomic History   Marital status: Single    Spouse name: Not on file   Number of children: Not on file   Years of education: Not on file   Highest education level: Not on file  Occupational History   Not on file  Tobacco Use   Smoking status: Former   Smokeless tobacco: Never   Tobacco comments:    45 years ago briefly  Vaping Use   Vaping Use: Never used  Substance and Sexual Activity   Alcohol use: No   Drug use: No   Sexual activity: Not on file  Other Topics Concern   Not on file  Social History Narrative   Not on file   Social Determinants of Health   Financial Resource Strain: Not on file  Food Insecurity: No Food Insecurity (06/19/2023)   Hunger Vital Sign    Worried About Running Out of Food in the Last Year: Never true    Ran Out of Food in the Last Year: Never true  Transportation Needs: Unmet Transportation Needs (06/19/2023)   PRAPARE - Administrator, Civil Service (Medical): Yes    Lack of Transportation (Non-Medical): No  Physical Activity: Not on file  Stress: Not on file  Social Connections: Not on file  Intimate Partner Violence: Not At Risk (06/19/2023)   Humiliation, Afraid, Rape, and Kick questionnaire    Fear of Current or Ex-Partner: No    Emotionally Abused: No     Physically Abused: No    Sexually Abused: No    Family History:    Family History  Problem Relation Age of Onset   Heart disease Neg Hx      ROS:  Please see the history of present illness.   All other ROS reviewed and negative.     Physical Exam/Data:   Vitals:   06/19/23 0415 06/19/23 0543 06/19/23 0558 06/19/23 0805  BP: (!) 110/49 118/60  (!) 105/58  Pulse: 74 75  80  Resp: 17 17  (!) 23  Temp:  98.3 F (36.8 C)  98 F (36.7 C)  TempSrc:  Oral  Oral  SpO2: 98% 99%  100%  Weight:  103.8 kg 103.5 kg   Height:  5\' 3"  (1.6 m) 5'  3" (1.6 m)     Intake/Output Summary (Last 24 hours) at 06/19/2023 1605 Last data filed at 06/19/2023 0800 Gross per 24 hour  Intake 240 ml  Output 1200 ml  Net -960 ml      06/19/2023    5:58 AM 06/19/2023    5:43 AM 06/18/2023   11:24 PM  Last 3 Weights  Weight (lbs) 228 lb 2.8 oz 228 lb 13.4 oz 220 lb  Weight (kg) 103.5 kg 103.8 kg 99.791 kg     Body mass index is 40.42 kg/m.  General:  Well nourished, well developed, in no acute distress HEENT: normal Neck: JVP 3/4 to mandible with HOB at 35 degrees Vascular: No carotid bruits; Distal pulses 2+ bilaterally Cardiac:  normal S1, S2; RRR; no murmur  Lungs:  bibasilar crackles Abd: soft, nontender, no hepatomegaly  Ext: 3+ bilateral LE edema to knees  Musculoskeletal:  No deformities, BUE and BLE strength normal and equal. Feet with long dystrophic nails Skin: warm and dry  Neuro:  CNs 2-12 intact, no focal abnormalities noted Psych:  Normal affect   EKG:  The EKG was personally reviewed and demonstrates:  sinus rhythm with artifact Telemetry:  Telemetry was personally reviewed and demonstrates:  sinus rhythm with PVCs and blocked PACs.  Relevant CV Studies:  08/02/2017 TTE  Study Conclusions   - Left ventricle: The cavity size was normal. Systolic function was    normal. The estimated ejection fraction was in the range of 60%    to 65%. Wall motion was normal; there were no  regional wall    motion abnormalities. Features are consistent with a pseudonormal    left ventricular filling pattern, with concomitant abnormal    relaxation and increased filling pressure (grade 2 diastolic    dysfunction).  - Aortic valve: There was trivial regurgitation.  - Pulmonary arteries: PA peak pressure: 41 mm Hg (S).   Impressions:   - The right ventricular systolic pressure was increased consistent    with moderate pulmonary hypertension.   -------------------------------------------------------------------  Study data:  No prior study was available for comparison.  Study  status:  Routine.  Procedure:  The patient reported no pain pre or  post test. Transthoracic echocardiography. Image quality was fair.  The study was technically difficult, as a result of body habitus.  Intravenous contrast (Definity) was administered.  Study  completion:  There were no complications.          Transthoracic  echocardiography.  M-mode, complete 2D, spectral Doppler, and color  Doppler.  Birthdate:  Patient birthdate: Dec 21, 1931.  Age:  Patient  is 89 yr old.  Sex:  Gender: female.    BMI: 35.3 kg/m^2.  Blood  pressure:     99/58  Patient status:  Inpatient.  Study date:  Study date: 08/02/2017. Study time: 12:31 PM.  Location:  Bedside.    -------------------------------------------------------------------   -------------------------------------------------------------------  Left ventricle:  The cavity size was normal. Systolic function was  normal. The estimated ejection fraction was in the range of 60% to  65%. Wall motion was normal; there were no regional wall motion  abnormalities. Features are consistent with a pseudonormal left  ventricular filling pattern, with concomitant abnormal relaxation  and increased filling pressure (grade 2 diastolic dysfunction).   -------------------------------------------------------------------  Aortic valve:   Trileaflet; normal thickness  leaflets. Mobility was  not restricted.  Doppler:  Transvalvular velocity was within the  normal range. There was no stenosis. There was trivial  regurgitation.   -------------------------------------------------------------------  Aorta: Aortic root: The aortic root was normal in size.   -------------------------------------------------------------------  Mitral valve:   Structurally normal valve.   Mobility was not  restricted.  Doppler:  Transvalvular velocity was within the normal  range. There was no evidence for stenosis. There was no  regurgitation.   -------------------------------------------------------------------  Left atrium:  The atrium was normal in size.   -------------------------------------------------------------------  Right ventricle:  The cavity size was normal. Wall thickness was  normal. Systolic function was normal.   -------------------------------------------------------------------  Pulmonic valve:    Structurally normal valve.   Cusp separation was  normal.  Doppler:  Transvalvular velocity was within the normal  range. There was no evidence for stenosis. There was no  regurgitation.   -------------------------------------------------------------------  Tricuspid valve:   Structurally normal valve.    Doppler:  Transvalvular velocity was within the normal range. There was mild  regurgitation.   -------------------------------------------------------------------  Pulmonary artery:   The main pulmonary artery was normal-sized.  Systolic pressure was within the normal range.   -------------------------------------------------------------------  Right atrium:  The atrium was normal in size.   -------------------------------------------------------------------  Pericardium: There was no pericardial effusion.   -------------------------------------------------------------------  Systemic veins:  Inferior vena cava: The vessel was normal in size.    -------------------------------------------------------------------  Measurements    Pulmonary arteries        Value       Reference   PA pressure, S, DP (H)    41    mm Hg <=30     Systemic veins            Value       Reference   Estimated CVP             3     mm Hg --------   Laboratory Data:  High Sensitivity Troponin:  No results for input(s): "TROPONINIHS" in the last 720 hours.   Chemistry Recent Labs  Lab 06/19/23 0152  NA 136  K 3.3*  CL 101  CO2 25  GLUCOSE 85  BUN 14  CREATININE 0.82  CALCIUM 8.8*  GFRNONAA >60  ANIONGAP 10    Recent Labs  Lab 06/19/23 0152  PROT 6.3*  ALBUMIN 3.0*  AST 28  ALT 27  ALKPHOS 94  BILITOT 0.7   Lipids No results for input(s): "CHOL", "TRIG", "HDL", "LABVLDL", "LDLCALC", "CHOLHDL" in the last 168 hours.  Hematology Recent Labs  Lab 06/19/23 0048  WBC 5.7  RBC 3.21*  HGB 10.3*  HCT 31.4*  MCV 97.8  MCH 32.1  MCHC 32.8  RDW 15.7*  PLT 316   Thyroid  Recent Labs  Lab 06/19/23 0801  TSH 21.466*    BNP Recent Labs  Lab 06/19/23 0048  BNP 76.5    DDimer No results for input(s): "DDIMER" in the last 168 hours.   Radiology/Studies:  [facility] Chest Port 1 View  Result Date: 06/19/2023 CLINICAL DATA:  Orthopnea. EXAM: PORTABLE CHEST 1 VIEW COMPARISON:  Radiographs 01/20/2018 FINDINGS: Stable cardiomegaly. Aortic atherosclerotic calcification. Pulmonary vascular congestion and interstitial coarsening. No focal consolidation, pleural effusion, or pneumothorax. No displaced rib fracture. IMPRESSION: Findings compatible with CHF. Electronically Signed   By: Minerva Fester M.D.   On: 06/19/2023 00:25     Assessment and Plan:   Acute on chronic HF  Hypothyroidism  Hypertension  Pulmonary hypertension  Hypokalemia  Anemia   Prior echo from 2018 shows PA pressure . Will update echo She is volume overloaded recently had peripheral edema jugular venous distention  pulmonary crackles.  Agree with  diuresis.  She is also profoundly hypothyroid.  She is not aware of her thyroid condition but has had thyroid prescriptions noted on her MAR: Few years ago.  They were 75 mcg, we will start her on 100 mcg and she will need close follow-up.  She carries a history of hypertension but her blood pressures have been normal here without antihypertensive therapy.  Not quite sure why but hypothyroidism is typically associated with higher blood pressure not lower blood pressure.  Low-grade chronic anemia   Potassium needs repletion    Her toe nails need attention   Risk Assessment/Risk Scores:        New York Heart Association (NYHA) Functional Class NYHA Class II-III         For questions or updates, please contact [facility] Please consult [email or web address] for contact info under    Signed, Perlie Gold, PA-C  06/19/2023 4:05 PM

## 2023-06-19 NOTE — Plan of Care (Signed)

## 2023-06-19 NOTE — ED Notes (Signed)
ED TO INPATIENT HANDOFF REPORT  ED Nurse Name and Phone #:  Aldonia Keeven, [phone]  S Name/Age/Gender Traci Meyer 89 y.o. female Room/Bed: 032C/032C  Code Status   Code Status: Prior  Home/SNF/Other Home Patient oriented to: self, place, time, and situation Is this baseline? Yes   Triage Complete: Triage complete  Chief Complaint CHF exacerbation (HCC) [I50.9]  Triage Note Pt BIB [facility] from home. CC: BLE edema, per pt/family Unable to get meds x3wks, takes lasix. Has been taking daughters instead.  Denies SOB.   EMS VS: 138/80, HR: 78, 95% roomair, cbg 98   Allergies Allergies  Allergen Reactions   Penicillins     Has patient had a PCN reaction causing immediate rash, facial/tongue/throat swelling, SOB or lightheadedness with hypotension: Yes Has patient had a PCN reaction causing severe rash involving mucus membranes or skin necrosis: No Has patient had a PCN reaction that required hospitalization: No Has patient had a PCN reaction occurring within the last 10 years: No If all of the above answers are "NO", then may proceed with Cephalosporin use.     Level of Care/Admitting Diagnosis ED Disposition     ED Disposition  Admit   Condition  --   Comment  Hospital Area: [facility] [[id number]]  Level of Care: Telemetry Cardiac [[id number]]  May admit patient to [facility] or [facility] if equivalent level of care is available:: No  Covid Evaluation: Asymptomatic - no recent exposure (last 10 days) testing not required  Diagnosis: CHF exacerbation [facility]) [[id number]]  Admitting Physician: Lurline Del [[id number]]  Attending Physician: Lurline Del [[id number]]  Certification:: I certify this patient is being admitted for an inpatient-only procedure  Estimated Length of Stay: 3          B Medical/Surgery History Past Medical History:  Diagnosis Date   Aortic atherosclerosis (HCC)    Arthritis    Hypertension    Past Surgical History:   Procedure Laterality Date   MULTIPLE TOOTH EXTRACTIONS       A IV Location/Drains/Wounds Patient Lines/Drains/Airways Status     Active Line/Drains/Airways     Name Placement date Placement time Site Days   Peripheral IV 06/19/23 20 G Right Antecubital 06/19/23  0044  Antecubital  less than 1   External Urinary Catheter 03/21/18  2006  --  1916            Intake/Output Last 24 hours  Intake/Output Summary (Last 24 hours) at 06/19/2023 0434 Last data filed at 06/19/2023 0342 Gross per 24 hour  Intake --  Output 1000 ml  Net -1000 ml    Labs/Imaging Results for orders placed or performed during the hospital encounter of 06/18/23 (from the past 48 hour(s))  CBC     Status: Abnormal   Collection Time: 06/19/23 12:48 AM  Result Value Ref Range   WBC 5.7 4.0 - 10.5 K/uL   RBC 3.21 (L) 3.87 - 5.11 MIL/uL   Hemoglobin 10.3 (L) 12.0 - 15.0 g/dL   HCT 95.2 (L) 84.1 - 32.4 %   MCV 97.8 80.0 - 100.0 fL   MCH 32.1 26.0 - 34.0 pg   MCHC 32.8 30.0 - 36.0 g/dL   RDW 40.1 (H) 02.7 - 25.3 %   Platelets 316 150 - 400 K/uL   nRBC 0.0 0.0 - 0.2 %    Comment: Performed at [facility], [address], [place], [place] [address]  Brain natriuretic peptide     Status: None  Collection Time: 06/19/23 12:48 AM  Result Value Ref Range   B Natriuretic Peptide 76.5 0.0 - 100.0 pg/mL    Comment: Performed at [facility], [address], [place], [place] [address]  Comprehensive metabolic panel     Status: Abnormal   Collection Time: 06/19/23  1:52 AM  Result Value Ref Range   Sodium 136 135 - 145 mmol/L   Potassium 3.3 (L) 3.5 - 5.1 mmol/L   Chloride 101 98 - 111 mmol/L   CO2 25 22 - 32 mmol/L   Glucose, Bld 85 70 - 99 mg/dL    Comment: Glucose reference range applies only to samples taken after fasting for at least 8 hours.   BUN 14 8 - 23 mg/dL   Creatinine, Ser 6.04 0.44 - 1.00 mg/dL   Calcium 8.8 (L) 8.9 - 10.3 mg/dL   Total Protein 6.3 (L) 6.5 - 8.1 g/dL   Albumin  3.0 (L) 3.5 - 5.0 g/dL   AST 28 15 - 41 U/L   ALT 27 0 - 44 U/L   Alkaline Phosphatase 94 38 - 126 U/L   Total Bilirubin 0.7 0.3 - 1.2 mg/dL   GFR, Estimated >54 >09 mL/min    Comment: (NOTE) Calculated using the CKD-EPI Creatinine Equation (2021)    Anion gap 10 5 - 15    Comment: Performed at [facility], [address], [place], [place] [address]   DG Chest Port 1 View  Result Date: 06/19/2023 CLINICAL DATA:  Orthopnea. EXAM: PORTABLE CHEST 1 VIEW COMPARISON:  Radiographs 01/20/2018 FINDINGS: Stable cardiomegaly. Aortic atherosclerotic calcification. Pulmonary vascular congestion and interstitial coarsening. No focal consolidation, pleural effusion, or pneumothorax. No displaced rib fracture. IMPRESSION: Findings compatible with CHF. Electronically Signed   By: Minerva Fester M.D.   On: 06/19/2023 00:25    Pending Labs Unresulted Labs (From admission, onward)    None       Vitals/Pain Today's Vitals   06/19/23 0145 06/19/23 0320 06/19/23 0410 06/19/23 0415  BP: 110/69   (!) 110/49  Pulse: 80 72  74  Resp: 19 15  17   Temp:   100.1 F (37.8 C)   TempSrc:   Oral   SpO2: 100% 100%  98%  Weight:      Height:      PainSc:        Isolation Precautions No active isolations  Medications Medications  furosemide (LASIX) injection 40 mg (40 mg Intravenous Given 06/19/23 0044)    Mobility Nonambulatory at baseline     Focused Assessments Cardiac Assessment Handoff:    Lab Results  Component Value Date   TROPONINI <0.03 08/02/2017   No results found for: "DDIMER" Does the Patient currently have chest pain? No    R Recommendations: See Admitting Provider Note  Report given to:   Additional Notes:  Pt A&Ox4, continent/incontinent. Pts daughter contact info has been verified.

## 2023-06-19 NOTE — ED Notes (Signed)
Pts daughter Camelia Eng requested to be notified when moved to inpatient.

## 2023-06-20 ENCOUNTER — Inpatient Hospital Stay (HOSPITAL_COMMUNITY): Payer: 59

## 2023-06-20 DIAGNOSIS — I5031 Acute diastolic (congestive) heart failure: Secondary | ICD-10-CM | POA: Diagnosis not present

## 2023-06-20 DIAGNOSIS — I5033 Acute on chronic diastolic (congestive) heart failure: Secondary | ICD-10-CM | POA: Diagnosis not present

## 2023-06-20 LAB — ECHOCARDIOGRAM COMPLETE
AR max vel: 2.57 cm2
AV Area VTI: 2.56 cm2
AV Area mean vel: 2.49 cm2
AV Mean grad: 5 mmHg
AV Peak grad: 10 mmHg
Ao pk vel: 1.59 m/s
Area-P 1/2: 3.63 cm2
Height: 63 in
S' Lateral: 2.7 cm
Weight: 3612.02 oz

## 2023-06-20 LAB — BASIC METABOLIC PANEL
Anion gap: 8 (ref 5–15)
BUN: 18 mg/dL (ref 8–23)
CO2: 29 mmol/L (ref 22–32)
Calcium: 8.8 mg/dL — ABNORMAL LOW (ref 8.9–10.3)
Chloride: 100 mmol/L (ref 98–111)
Creatinine, Ser: 0.85 mg/dL (ref 0.44–1.00)
GFR, Estimated: >60 mL/min (ref >60)
Glucose, Bld: 93 mg/dL (ref 70–99)
Potassium: 3.6 mmol/L (ref 3.5–5.1)
Sodium: 137 mmol/L (ref 135–145)

## 2023-06-20 MED ORDER — PERFLUTREN LIPID MICROSPHERE
1.0000 mL | INTRAVENOUS | Status: AC | PRN
  Administered 2023-06-20: 2 mL via INTRAVENOUS

## 2023-06-20 NOTE — Progress Notes (Signed)
Progress Note  Patient Name: Traci Meyer Date of Encounter: 06/20/2023  Primary Cardiologist: None     Patient Profile     89 y.o. female admitted with edema acute/chronic  HFpEF, off medications for some period of time because of a move prompted by eviction, found to be hypothyroid also off medications Echo pending Subjective   Feels better.  No shortness of breath  Inpatient Medications    Scheduled Meds:  enoxaparin (LOVENOX) injection  40 mg Subcutaneous Q24H   furosemide  20 mg Intravenous BID   levothyroxine  100 mcg Oral QAC breakfast   nystatin   Topical BID   sodium chloride flush  3 mL Intravenous Q12H   Continuous Infusions:  sodium chloride     PRN Meds: sodium chloride, acetaminophen, ondansetron (ZOFRAN) IV, sodium chloride flush   Vital Signs    Vitals:   06/19/23 1600 06/19/23 2017 06/20/23 0116 06/20/23 0510  BP: 123/61 117/66 114/65 127/66  Pulse: 76 74 74 70  Resp: 20 20 18 17   Temp: 98.7 F (37.1 C) 98.2 F (36.8 C) 98.6 F (37 C) 97.6 F (36.4 C)  TempSrc: Oral Oral Oral Oral  SpO2: 98% 97% 98% 96%  Weight:    102.4 kg  Height:        Intake/Output Summary (Last 24 hours) at 06/20/2023 1610 Last data filed at 06/20/2023 0122 Gross per 24 hour  Intake 480 ml  Output 1750 ml  Net -1270 ml   Filed Weights   06/19/23 0543 06/19/23 0558 06/20/23 0510  Weight: 103.8 kg 103.5 kg 102.4 kg    Telemetry    Nsr  - Personally Reviewed  ECG     Physical Exam   GEN: No acute distress.   Neck: Not assessed  Cardiac: RRR, no murmurs, rubs, or gallops.  Respiratory: crackles  GI: Soft, nontender, non-distended  MS: No edema; No deformity. Neuro:  Nonfocal  Psych: Normal affect   Labs    Chemistry Recent Labs  Lab 06/19/23 0152 06/20/23 0328  NA 136 137  K 3.3* 3.6  CL 101 100  CO2 25 29  GLUCOSE 85 93  BUN 14 18  CREATININE 0.82 0.85  CALCIUM 8.8* 8.8*  PROT 6.3*  --   ALBUMIN 3.0*  --   AST 28  --   ALT 27   --   ALKPHOS 94  --   BILITOT 0.7  --   GFRNONAA >60 >60  ANIONGAP 10 8     Hematology Recent Labs  Lab 06/19/23 0048  WBC 5.7  RBC 3.21*  HGB 10.3*  HCT 31.4*  MCV 97.8  MCH 32.1  MCHC 32.8  RDW 15.7*  PLT 316    Cardiac EnzymesNo results for input(s): "TROPONINI" in the last 168 hours. No results for input(s): "TROPIPOC" in the last 168 hours.   BNP Recent Labs  Lab 06/19/23 0048  BNP 76.5     DDimer No results for input(s): "DDIMER" in the last 168 hours.   Radiology    DG Chest Port 1 View  Result Date: 06/19/2023 CLINICAL DATA:  Orthopnea. EXAM: PORTABLE CHEST 1 VIEW COMPARISON:  Radiographs 01/20/2018 FINDINGS: Stable cardiomegaly. Aortic atherosclerotic calcification. Pulmonary vascular congestion and interstitial coarsening. No focal consolidation, pleural effusion, or pneumothorax. No displaced rib fracture. IMPRESSION: Findings compatible with CHF. Electronically Signed   By: Minerva Fester M.D.   On: 06/19/2023 00:25    Cardiac Studies   Echo pending Assessment & Plan  Acute on chronic HF   Hypothyroidism   Hypertension   Pulmonary hypertension   Hypokalemia   Anemia     Volume status improved  Continue diuresis following BUN/creatinine  Blood pressure low, again not quite sure why.  Potassium repleted  Await echocardiogram      For questions or updates, please contact [facility] Please consult [email or web address] for contact info under Cardiology/STEMI.      Signed, Sherryl Manges, MD  06/20/2023, 6:21 AM

## 2023-06-20 NOTE — Progress Notes (Signed)
*  PRELIMINARY RESULTS* Echocardiogram 2D Echocardiogram has been performed.  Traci Meyer 06/20/2023, 8:24 AM

## 2023-06-20 NOTE — Plan of Care (Signed)

## 2023-06-20 NOTE — Progress Notes (Signed)
PROGRESS NOTE    Traci Meyer  VHQ:[id number] DOB: 1931/05/30 DOA: 06/18/2023 PCP: Pcp, No  89/F with history of chronic diastolic CHF, hypertension, dyslipidemia, hypothyroidism, arthritis, morbid obesity presented to the ED with worsening lower extremity swelling.  She is wheelchair-bound, recently moved to [place] to live with her daughter, ran out of meds 3 weeks ago, tried some of her daughters Lasix. -In the ER temp is 100.1, vital signs stable, hemoglobin 10, BNP 76, chest x-ray noted cardiomegaly and pulmonary vascular congestion and CHF   Subjective:feeling better, swelling starting to improve   Assessment and Plan:  Acute on chronic diastolic CHF  -Ran out of meds including diuretics 3 weeks ago -Last echo 2018 with a EF 60-65%, grade 2 diastolic dysfunction -Cards following, follow-up repeat echo -Continue IV Lasix today -Soft BP limiting GDMT   Hypothyroidism -Likely taking levothyroxine -TSH elevated 21.4, free T4 is normal -Restarted Synthroid 100 mcg, needs repeat TSH   Hypokalemia -Repleted   Essential hypertension -Soft, continue Lasix, holding amlodipine   Normocytic anemia -Chronic, monitor  Morbid obesity BMI 40.42 kg/m   DVT prophylaxis: Lovenox Advance Care Planning:   Code Status: Full Code  Family Communication:none present Disposition Plan: may need SNF  Consultants:    Procedures:   Antimicrobials:    Objective: Vitals:   06/19/23 1600 06/19/23 2017 06/20/23 0116 06/20/23 0510  BP: 123/61 117/66 114/65 127/66  Pulse: 76 74 74 70  Resp: 20 20 18 17   Temp: 98.7 F (37.1 C) 98.2 F (36.8 C) 98.6 F (37 C) 97.6 F (36.4 C)  TempSrc: Oral Oral Oral Oral  SpO2: 98% 97% 98% 96%  Weight:    102.4 kg  Height:        Intake/Output Summary (Last 24 hours) at 06/20/2023 1134 Last data filed at 06/20/2023 0122 Gross per 24 hour  Intake 240 ml  Output 1550 ml  Net -1310 ml   Filed Weights   06/19/23 0543 06/19/23 0558  06/20/23 0510  Weight: 103.8 kg 103.5 kg 102.4 kg    Examination:  General exam: Obese chronically ill female sitting up in bed, AAOx3 HEENT: Positive JVD CVS: S1-S2, regular rhythm Lungs: Few basilar rales Abdomen: Soft, nontender, bowel sounds present Extremities: 2+ edema    Data Reviewed:   CBC: Recent Labs  Lab 06/19/23 0048  WBC 5.7  HGB 10.3*  HCT 31.4*  MCV 97.8  PLT 316   Basic Metabolic Panel: Recent Labs  Lab 06/19/23 0152 06/20/23 0328  NA 136 137  K 3.3* 3.6  CL 101 100  CO2 25 29  GLUCOSE 85 93  BUN 14 18  CREATININE 0.82 0.85  CALCIUM 8.8* 8.8*   GFR: Estimated Creatinine Clearance: 50.3 mL/min (by C-G formula based on SCr of 0.85 mg/dL). Liver Function Tests: Recent Labs  Lab 06/19/23 0152  AST 28  ALT 27  ALKPHOS 94  BILITOT 0.7  PROT 6.3*  ALBUMIN 3.0*   No results for input(s): "LIPASE", "AMYLASE" in the last 168 hours. No results for input(s): "AMMONIA" in the last 168 hours. Coagulation Profile: No results for input(s): "INR", "PROTIME" in the last 168 hours. Cardiac Enzymes: No results for input(s): "CKTOTAL", "CKMB", "CKMBINDEX", "TROPONINI" in the last 168 hours. BNP (last 3 results) No results for input(s): "PROBNP" in the last 8760 hours. HbA1C: No results for input(s): "HGBA1C" in the last 72 hours. CBG: No results for input(s): "GLUCAP" in the last 168 hours. Lipid Profile: No results for input(s): "CHOL", "HDL", "LDLCALC", "TRIG", "  CHOLHDL", "LDLDIRECT" in the last 72 hours. Thyroid Function Tests: Recent Labs    06/19/23 0801  TSH 21.466*  FREET4 0.61   Anemia Panel: No results for input(s): "VITAMINB12", "FOLATE", "FERRITIN", "TIBC", "IRON", "RETICCTPCT" in the last 72 hours. Urine analysis:    Component Value Date/Time   COLORURINE YELLOW 03/21/2018 0405   APPEARANCEUR CLOUDY (A) 03/21/2018 0405   LABSPEC 1.024 03/21/2018 0405   PHURINE 5.0 03/21/2018 0405   GLUCOSEU NEGATIVE 03/21/2018 0405   HGBUR  NEGATIVE 03/21/2018 0405   BILIRUBINUR NEGATIVE 03/21/2018 0405   KETONESUR NEGATIVE 03/21/2018 0405   PROTEINUR 30 (A) 03/21/2018 0405   NITRITE NEGATIVE 03/21/2018 0405   LEUKOCYTESUR LARGE (A) 03/21/2018 0405   Sepsis Labs: @LABRCNTIP (procalcitonin:4,lacticidven:4)  )No results found for this or any previous visit (from the past 240 hour(s)).   Radiology Studies: ECHOCARDIOGRAM COMPLETE  Result Date: 06/20/2023    ECHOCARDIOGRAM REPORT   Patient Name:   Traci Meyer Date of Exam: 06/20/2023 Medical Rec #:  [id number]        Height:       63.0 in Accession #:    [id number]       Weight:       225.7 lb Date of Birth:  [birth year]        BSA:          2.036 m Patient Age:    89 years         BP:           127/66 mmHg Patient Gender: F                HR:           67 bpm. Exam Location:  Inpatient Procedure: 2D Echo, Cardiac Doppler, Color Doppler and Intracardiac            Opacification Agent Indications:    CHF-Acute Diastolic I50.31  History:        Patient has prior history of Echocardiogram examinations, most                 recent 08/02/2017. CHF, Arrythmias:Atrial Fibrillation; Risk                 Factors:Hypertension, Dyslipidemia and Former Smoker.  Sonographer:    Dondra Prader RVT RCS Referring Phys: [phone] RONDELL A SMITH  Sonographer Comments: Technically challenging study due to limited acoustic windows, Technically difficult study due to poor echo windows, suboptimal parasternal window, suboptimal apical window, suboptimal subcostal window and patient is obese. Image acquisition challenging due to patient body habitus. IMPRESSIONS  1. Left ventricular ejection fraction, by estimation, is 60 to 65%. The left ventricle has normal function. The left ventricle has no regional wall motion abnormalities. There is moderate asymmetric left ventricular hypertrophy of the basal-septal segment. Left ventricular diastolic parameters are indeterminate.  2. Right ventricular systolic function is  normal. The right ventricular size is mildly enlarged. There is normal pulmonary artery systolic pressure.  3. Left atrial size was moderately dilated.  4. Right atrial size was mildly dilated.  5. The mitral valve is grossly normal. Trivial mitral valve regurgitation. No evidence of mitral stenosis.  6. The aortic valve is tricuspid. There is mild calcification of the aortic valve. There is mild thickening of the aortic valve. Aortic valve regurgitation is trivial. Aortic valve sclerosis/calcification is present, without any evidence of aortic stenosis.  7. Pulmonic valve regurgitation is moderate.  8. Aortic dilatation noted. There is mild dilatation of the ascending  aorta, measuring 40 mm.  9. The inferior vena cava is normal in size with greater than 50% respiratory variability, suggesting right atrial pressure of 3 mmHg. Comparison(s): No significant change from prior study. Conclusion(s)/Recommendation(s): Otherwise normal echocardiogram, with minor abnormalities described in the report. FINDINGS  Left Ventricle: Left ventricular ejection fraction, by estimation, is 60 to 65%. The left ventricle has normal function. The left ventricle has no regional wall motion abnormalities. Definity contrast agent was given IV to delineate the left ventricular  endocardial borders. The left ventricular internal cavity size was normal in size. There is moderate asymmetric left ventricular hypertrophy of the basal-septal segment. Left ventricular diastolic parameters are indeterminate. Right Ventricle: The right ventricular size is mildly enlarged. Right vetricular wall thickness was not well visualized. Right ventricular systolic function is normal. There is normal pulmonary artery systolic pressure. The tricuspid regurgitant velocity  is 1.40 m/s, and with an assumed right atrial pressure of 3 mmHg, the estimated right ventricular systolic pressure is 10.8 mmHg. Left Atrium: Left atrial size was moderately dilated. Right  Atrium: Right atrial size was mildly dilated. Pericardium: There is no evidence of pericardial effusion. Mitral Valve: The mitral valve is grossly normal. Trivial mitral valve regurgitation. No evidence of mitral valve stenosis. Tricuspid Valve: The tricuspid valve is grossly normal. Tricuspid valve regurgitation is trivial. No evidence of tricuspid stenosis. Aortic Valve: The aortic valve is tricuspid. There is mild calcification of the aortic valve. There is mild thickening of the aortic valve. Aortic valve regurgitation is trivial. Aortic valve sclerosis/calcification is present, without any evidence of aortic stenosis. Aortic valve mean gradient measures 5.0 mmHg. Aortic valve peak gradient measures 10.0 mmHg. Aortic valve area, by VTI measures 2.56 cm. Pulmonic Valve: The pulmonic valve was not well visualized. Pulmonic valve regurgitation is moderate. No evidence of pulmonic stenosis. Aorta: Aortic dilatation noted. There is mild dilatation of the ascending aorta, measuring 40 mm. Venous: The inferior vena cava is normal in size with greater than 50% respiratory variability, suggesting right atrial pressure of 3 mmHg. IAS/Shunts: The interatrial septum was not well visualized.  LEFT VENTRICLE PLAX 2D LVIDd:         4.10 cm   Diastology LVIDs:         2.70 cm   LV e' medial:    5.93 cm/s LV PW:         1.10 cm   LV E/e' medial:  11.2 LV IVS:        1.60 cm   LV e' lateral:   8.85 cm/s LVOT diam:     2.00 cm   LV E/e' lateral: 7.5 LV SV:         87 LV SV Index:   43 LVOT Area:     3.14 cm  RIGHT VENTRICLE             IVC RV Basal diam:  4.20 cm     IVC diam: 1.90 cm RV Mid diam:    3.20 cm RV S prime:     17.40 cm/s TAPSE (M-mode): 2.5 cm LEFT ATRIUM             Index        RIGHT ATRIUM           Index LA diam:        2.90 cm 1.42 cm/m   RA Area:     14.60 cm LA Vol (A2C):   98.2 ml 48.24 ml/m  RA Volume:   42.20  ml  20.73 ml/m LA Vol (A4C):   80.8 ml 39.69 ml/m LA Biplane Vol: 89.4 ml 43.91 ml/m   AORTIC VALVE                     PULMONIC VALVE AV Area (Vmax):    2.57 cm      PV Vmax:          1.09 m/s AV Area (Vmean):   2.49 cm      PV Peak grad:     4.8 mmHg AV Area (VTI):     2.56 cm      PR End Diast Vel: 6.86 msec AV Vmax:           158.50 cm/s AV Vmean:          101.500 cm/s AV VTI:            0.338 m AV Peak Grad:      10.0 mmHg AV Mean Grad:      5.0 mmHg LVOT Vmax:         129.50 cm/s LVOT Vmean:        80.400 cm/s LVOT VTI:          0.276 m LVOT/AV VTI ratio: 0.82  AORTA Ao Root diam: 2.80 cm Ao Asc diam:  4.00 cm MITRAL VALVE               TRICUSPID VALVE MV Area (PHT): 3.63 cm    TR Peak grad:   7.8 mmHg MV Decel Time: 209 msec    TR Vmax:        140.00 cm/s MV E velocity: 66.30 cm/s MV A velocity: 79.20 cm/s  SHUNTS MV E/A ratio:  0.84        Systemic VTI:  0.28 m                            Systemic Diam: 2.00 cm Jodelle Red MD Electronically signed by Jodelle Red MD Signature Date/Time: 06/20/2023/10:37:53 AM    Final    DG Chest Port 1 View  Result Date: 06/19/2023 CLINICAL DATA:  Orthopnea. EXAM: PORTABLE CHEST 1 VIEW COMPARISON:  Radiographs 01/20/2018 FINDINGS: Stable cardiomegaly. Aortic atherosclerotic calcification. Pulmonary vascular congestion and interstitial coarsening. No focal consolidation, pleural effusion, or pneumothorax. No displaced rib fracture. IMPRESSION: Findings compatible with CHF. Electronically Signed   By: Minerva Fester M.D.   On: 06/19/2023 00:25     Scheduled Meds:  enoxaparin (LOVENOX) injection  40 mg Subcutaneous Q24H   furosemide  20 mg Intravenous BID   levothyroxine  100 mcg Oral QAC breakfast   nystatin   Topical BID   sodium chloride flush  3 mL Intravenous Q12H   Continuous Infusions:  sodium chloride       LOS: 1 day    Time spent:    Zannie Cove, MD [facility]   06/20/2023, 11:34 AM

## 2023-06-21 DIAGNOSIS — I5033 Acute on chronic diastolic (congestive) heart failure: Secondary | ICD-10-CM | POA: Diagnosis not present

## 2023-06-21 LAB — COMPREHENSIVE METABOLIC PANEL
ALT: 35 U/L (ref 0–44)
AST: 44 U/L — ABNORMAL HIGH (ref 15–41)
Albumin: 2.6 g/dL — ABNORMAL LOW (ref 3.5–5.0)
Alkaline Phosphatase: 84 U/L (ref 38–126)
Anion gap: 8 (ref 5–15)
BUN: 17 mg/dL (ref 8–23)
CO2: 30 mmol/L (ref 22–32)
Calcium: 8.8 mg/dL — ABNORMAL LOW (ref 8.9–10.3)
Chloride: 98 mmol/L (ref 98–111)
Creatinine, Ser: 0.81 mg/dL (ref 0.44–1.00)
GFR, Estimated: >60 mL/min (ref >60)
Glucose, Bld: 86 mg/dL (ref 70–99)
Potassium: 3.7 mmol/L (ref 3.5–5.1)
Sodium: 136 mmol/L (ref 135–145)
Total Bilirubin: 0.4 mg/dL (ref 0.3–1.2)
Total Protein: 5.8 g/dL — ABNORMAL LOW (ref 6.5–8.1)

## 2023-06-21 LAB — BASIC METABOLIC PANEL
Anion gap: 15 (ref 5–15)
BUN: 15 mg/dL (ref 8–23)
CO2: 28 mmol/L (ref 22–32)
Calcium: 9.3 mg/dL (ref 8.9–10.3)
Chloride: 94 mmol/L — ABNORMAL LOW (ref 98–111)
Creatinine, Ser: 0.75 mg/dL (ref 0.44–1.00)
GFR, Estimated: >60 mL/min (ref >60)
Glucose, Bld: 94 mg/dL (ref 70–99)
Potassium: 3.8 mmol/L (ref 3.5–5.1)
Sodium: 137 mmol/L (ref 135–145)

## 2023-06-21 LAB — CBC
HCT: 29.8 % — ABNORMAL LOW (ref 36.0–46.0)
Hemoglobin: 9.7 g/dL — ABNORMAL LOW (ref 12.0–15.0)
MCH: 31.6 pg (ref 26.0–34.0)
MCHC: 32.6 g/dL (ref 30.0–36.0)
MCV: 97.1 fL (ref 80.0–100.0)
Platelets: 257 10*3/uL (ref 150–400)
RBC: 3.07 MIL/uL — ABNORMAL LOW (ref 3.87–5.11)
RDW: 15.3 % (ref 11.5–15.5)
WBC: 5.3 10*3/uL (ref 4.0–10.5)
nRBC: 0 % (ref 0.0–0.2)

## 2023-06-21 LAB — CALCIUM, IONIZED: Calcium, Ionized, Serum: 5 mg/dL (ref 4.5–5.6)

## 2023-06-21 MED ORDER — SPIRONOLACTONE 25 MG PO TABS
25.0000 mg | ORAL_TABLET | Freq: Every day | ORAL | Status: DC
  Administered 2023-06-21 – 2023-06-22 (×2): 25 mg via ORAL
  Filled 2023-06-21 (×2): qty 1

## 2023-06-21 NOTE — Progress Notes (Addendum)
PROGRESS NOTE    Traci Meyer  ZOX:[id number] DOB: Apr 03, 1931 DOA: 06/18/2023 PCP: Pcp, No  89/F with history of chronic diastolic CHF, hypertension, dyslipidemia, hypothyroidism, arthritis, morbid obesity presented to the ED with worsening lower extremity swelling.  She is wheelchair-bound, recently moved to [place] to live with her daughter, ran out of meds 3 weeks ago, tried some of her daughters Lasix. -In the ER temp is 100.1, vital signs stable, hemoglobin 10, BNP 76, chest x-ray noted cardiomegaly and pulmonary vascular congestion and CHF   Subjective:feeling better, swelling starting to improve   Assessment and Plan:  Acute on chronic diastolic CHF  -Ran out of meds including diuretics 3 weeks ago -Last echo 2018 with a EF 60-65%, grade 2 diastolic dysfunction -Cards following, repeat echo with preserved EF, RV is normal -Continue IV Lasix today, labs pending this morning -Add Aldactone -Poor candidate for SGLT2i with obesity and limited mobility -PT/OT   Hypothyroidism -Likely taking levothyroxine -TSH elevated 21.4, free T4 is normal -Restarted Synthroid 100 mcg, needs repeat TSH   Hypokalemia -Repleted   Essential hypertension -Soft, continue Lasix, holding amlodipine   Normocytic anemia -Chronic, monitor  Morbid obesity BMI 40.42 kg/m   DVT prophylaxis: Lovenox Advance Care Planning:   Code Status: Full Code  Family Communication: Daughter at bedside Disposition Plan: Home likely 1 to 2 days  Consultants:    Procedures:   Antimicrobials:    Objective: Vitals:   06/20/23 1953 06/20/23 2329 06/21/23 0531 06/21/23 0736  BP: 120/62 122/65 123/68 135/68  Pulse: 73 80 67   Resp: 18 20 15    Temp: 98.8 F (37.1 C) 98.8 F (37.1 C) 98.8 F (37.1 C) 98.2 F (36.8 C)  TempSrc: Oral Oral Oral Oral  SpO2: 98% 95% 97%   Weight:   102 kg   Height:        Intake/Output Summary (Last 24 hours) at 06/21/2023 1018 Last data filed at 06/21/2023  1000 Gross per 24 hour  Intake 240 ml  Output 3400 ml  Net -3160 ml   Filed Weights   06/19/23 0558 06/20/23 0510 06/21/23 0531  Weight: 103.5 kg 102.4 kg 102 kg    Examination:  General exam: Obese chronically ill female sitting up in bed, AAOx3 HEENT: Positive JVD CVS: S1-S2, regular rhythm Lungs: Few basilar rales Abdomen: Soft, nontender, bowel sounds present Extremities: 1+ edema    Data Reviewed:   CBC: Recent Labs  Lab 06/19/23 0048 06/21/23 0247  WBC 5.7 5.3  HGB 10.3* 9.7*  HCT 31.4* 29.8*  MCV 97.8 97.1  PLT 316 257   Basic Metabolic Panel: Recent Labs  Lab 06/19/23 0152 06/20/23 0328 06/21/23 0247  NA 136 137 136  K 3.3* 3.6 3.7  CL 101 100 98  CO2 25 29 30   GLUCOSE 85 93 86  BUN 14 18 17   CREATININE 0.82 0.85 0.81  CALCIUM 8.8* 8.8* 8.8*   GFR: Estimated Creatinine Clearance: 52.6 mL/min (by C-G formula based on SCr of 0.81 mg/dL). Liver Function Tests: Recent Labs  Lab 06/19/23 0152 06/21/23 0247  AST 28 44*  ALT 27 35  ALKPHOS 94 84  BILITOT 0.7 0.4  PROT 6.3* 5.8*  ALBUMIN 3.0* 2.6*   No results for input(s): "LIPASE", "AMYLASE" in the last 168 hours. No results for input(s): "AMMONIA" in the last 168 hours. Coagulation Profile: No results for input(s): "INR", "PROTIME" in the last 168 hours. Cardiac Enzymes: No results for input(s): "CKTOTAL", "CKMB", "CKMBINDEX", "TROPONINI" in the last 168  hours. BNP (last 3 results) No results for input(s): "PROBNP" in the last 8760 hours. HbA1C: No results for input(s): "HGBA1C" in the last 72 hours. CBG: No results for input(s): "GLUCAP" in the last 168 hours. Lipid Profile: No results for input(s): "CHOL", "HDL", "LDLCALC", "TRIG", "CHOLHDL", "LDLDIRECT" in the last 72 hours. Thyroid Function Tests: Recent Labs    06/19/23 0801  TSH 21.466*  FREET4 0.61   Anemia Panel: No results for input(s): "VITAMINB12", "FOLATE", "FERRITIN", "TIBC", "IRON", "RETICCTPCT" in the last 72  hours. Urine analysis:    Component Value Date/Time   COLORURINE YELLOW 03/21/2018 0405   APPEARANCEUR CLOUDY (A) 03/21/2018 0405   LABSPEC 1.024 03/21/2018 0405   PHURINE 5.0 03/21/2018 0405   GLUCOSEU NEGATIVE 03/21/2018 0405   HGBUR NEGATIVE 03/21/2018 0405   BILIRUBINUR NEGATIVE 03/21/2018 0405   KETONESUR NEGATIVE 03/21/2018 0405   PROTEINUR 30 (A) 03/21/2018 0405   NITRITE NEGATIVE 03/21/2018 0405   LEUKOCYTESUR LARGE (A) 03/21/2018 0405   Sepsis Labs: @LABRCNTIP (procalcitonin:4,lacticidven:4)  )No results found for this or any previous visit (from the past 240 hour(s)).   Radiology Studies: ECHOCARDIOGRAM COMPLETE  Result Date: 06/20/2023    ECHOCARDIOGRAM REPORT   Patient Name:   ORETTA Meyer Date of Exam: 06/20/2023 Medical Rec #:  [id number]        Height:       63.0 in Accession #:    [id number]       Weight:       225.7 lb Date of Birth:  [birth year]        BSA:          2.036 m Patient Age:    89 years         BP:           127/66 mmHg Patient Gender: F                HR:           67 bpm. Exam Location:  Inpatient Procedure: 2D Echo, Cardiac Doppler, Color Doppler and Intracardiac            Opacification Agent Indications:    CHF-Acute Diastolic I50.31  History:        Patient has prior history of Echocardiogram examinations, most                 recent 08/02/2017. CHF, Arrythmias:Atrial Fibrillation; Risk                 Factors:Hypertension, Dyslipidemia and Former Smoker.  Sonographer:    Dondra Prader RVT RCS Referring Phys: [phone] RONDELL A SMITH  Sonographer Comments: Technically challenging study due to limited acoustic windows, Technically difficult study due to poor echo windows, suboptimal parasternal window, suboptimal apical window, suboptimal subcostal window and patient is obese. Image acquisition challenging due to patient body habitus. IMPRESSIONS  1. Left ventricular ejection fraction, by estimation, is 60 to 65%. The left ventricle has normal function. The left  ventricle has no regional wall motion abnormalities. There is moderate asymmetric left ventricular hypertrophy of the basal-septal segment. Left ventricular diastolic parameters are indeterminate.  2. Right ventricular systolic function is normal. The right ventricular size is mildly enlarged. There is normal pulmonary artery systolic pressure.  3. Left atrial size was moderately dilated.  4. Right atrial size was mildly dilated.  5. The mitral valve is grossly normal. Trivial mitral valve regurgitation. No evidence of mitral stenosis.  6. The aortic valve is tricuspid. There is mild calcification  of the aortic valve. There is mild thickening of the aortic valve. Aortic valve regurgitation is trivial. Aortic valve sclerosis/calcification is present, without any evidence of aortic stenosis.  7. Pulmonic valve regurgitation is moderate.  8. Aortic dilatation noted. There is mild dilatation of the ascending aorta, measuring 40 mm.  9. The inferior vena cava is normal in size with greater than 50% respiratory variability, suggesting right atrial pressure of 3 mmHg. Comparison(s): No significant change from prior study. Conclusion(s)/Recommendation(s): Otherwise normal echocardiogram, with minor abnormalities described in the report. FINDINGS  Left Ventricle: Left ventricular ejection fraction, by estimation, is 60 to 65%. The left ventricle has normal function. The left ventricle has no regional wall motion abnormalities. Definity contrast agent was given IV to delineate the left ventricular  endocardial borders. The left ventricular internal cavity size was normal in size. There is moderate asymmetric left ventricular hypertrophy of the basal-septal segment. Left ventricular diastolic parameters are indeterminate. Right Ventricle: The right ventricular size is mildly enlarged. Right vetricular wall thickness was not well visualized. Right ventricular systolic function is normal. There is normal pulmonary artery  systolic pressure. The tricuspid regurgitant velocity  is 1.40 m/s, and with an assumed right atrial pressure of 3 mmHg, the estimated right ventricular systolic pressure is 10.8 mmHg. Left Atrium: Left atrial size was moderately dilated. Right Atrium: Right atrial size was mildly dilated. Pericardium: There is no evidence of pericardial effusion. Mitral Valve: The mitral valve is grossly normal. Trivial mitral valve regurgitation. No evidence of mitral valve stenosis. Tricuspid Valve: The tricuspid valve is grossly normal. Tricuspid valve regurgitation is trivial. No evidence of tricuspid stenosis. Aortic Valve: The aortic valve is tricuspid. There is mild calcification of the aortic valve. There is mild thickening of the aortic valve. Aortic valve regurgitation is trivial. Aortic valve sclerosis/calcification is present, without any evidence of aortic stenosis. Aortic valve mean gradient measures 5.0 mmHg. Aortic valve peak gradient measures 10.0 mmHg. Aortic valve area, by VTI measures 2.56 cm. Pulmonic Valve: The pulmonic valve was not well visualized. Pulmonic valve regurgitation is moderate. No evidence of pulmonic stenosis. Aorta: Aortic dilatation noted. There is mild dilatation of the ascending aorta, measuring 40 mm. Venous: The inferior vena cava is normal in size with greater than 50% respiratory variability, suggesting right atrial pressure of 3 mmHg. IAS/Shunts: The interatrial septum was not well visualized.  LEFT VENTRICLE PLAX 2D LVIDd:         4.10 cm   Diastology LVIDs:         2.70 cm   LV e' medial:    5.93 cm/s LV PW:         1.10 cm   LV E/e' medial:  11.2 LV IVS:        1.60 cm   LV e' lateral:   8.85 cm/s LVOT diam:     2.00 cm   LV E/e' lateral: 7.5 LV SV:         87 LV SV Index:   43 LVOT Area:     3.14 cm  RIGHT VENTRICLE             IVC RV Basal diam:  4.20 cm     IVC diam: 1.90 cm RV Mid diam:    3.20 cm RV S prime:     17.40 cm/s TAPSE (M-mode): 2.5 cm LEFT ATRIUM             Index         RIGHT ATRIUM  Index LA diam:        2.90 cm 1.42 cm/m   RA Area:     14.60 cm LA Vol (A2C):   98.2 ml 48.24 ml/m  RA Volume:   42.20 ml  20.73 ml/m LA Vol (A4C):   80.8 ml 39.69 ml/m LA Biplane Vol: 89.4 ml 43.91 ml/m  AORTIC VALVE                     PULMONIC VALVE AV Area (Vmax):    2.57 cm      PV Vmax:          1.09 m/s AV Area (Vmean):   2.49 cm      PV Peak grad:     4.8 mmHg AV Area (VTI):     2.56 cm      PR End Diast Vel: 6.86 msec AV Vmax:           158.50 cm/s AV Vmean:          101.500 cm/s AV VTI:            0.338 m AV Peak Grad:      10.0 mmHg AV Mean Grad:      5.0 mmHg LVOT Vmax:         129.50 cm/s LVOT Vmean:        80.400 cm/s LVOT VTI:          0.276 m LVOT/AV VTI ratio: 0.82  AORTA Ao Root diam: 2.80 cm Ao Asc diam:  4.00 cm MITRAL VALVE               TRICUSPID VALVE MV Area (PHT): 3.63 cm    TR Peak grad:   7.8 mmHg MV Decel Time: 209 msec    TR Vmax:        140.00 cm/s MV E velocity: 66.30 cm/s MV A velocity: 79.20 cm/s  SHUNTS MV E/A ratio:  0.84        Systemic VTI:  0.28 m                            Systemic Diam: 2.00 cm Jodelle Red MD Electronically signed by Jodelle Red MD Signature Date/Time: 06/20/2023/10:37:53 AM    Final      Scheduled Meds:  enoxaparin (LOVENOX) injection  40 mg Subcutaneous Q24H   furosemide  20 mg Intravenous BID   levothyroxine  100 mcg Oral QAC breakfast   nystatin   Topical BID   sodium chloride flush  3 mL Intravenous Q12H   Continuous Infusions:  sodium chloride       LOS: 2 days    Time spent:    Zannie Cove, MD [facility]   06/21/2023, 10:18 AM

## 2023-06-21 NOTE — Progress Notes (Addendum)
Progress Note  Patient Name: Traci Meyer Date of Encounter: 06/21/2023  Primary Cardiologist: None  Subjective   Breathing, swelling improved. Feels a little lightheaded this AM. HR, BP wnl presently.  Had brief episode of AVB around 6:30am between 2 QRS complexes, no prolonged pauses, not clearly symptomatic   Inpatient Medications    Scheduled Meds:  enoxaparin (LOVENOX) injection  40 mg Subcutaneous Q24H   furosemide  20 mg Intravenous BID   levothyroxine  100 mcg Oral QAC breakfast   nystatin   Topical BID   sodium chloride flush  3 mL Intravenous Q12H   spironolactone  25 mg Oral Daily   Continuous Infusions:  sodium chloride     PRN Meds: sodium chloride, acetaminophen, ondansetron (ZOFRAN) IV, sodium chloride flush   Vital Signs    Vitals:   06/20/23 1953 06/20/23 2329 06/21/23 0531 06/21/23 0736  BP: 120/62 122/65 123/68 135/68  Pulse: 73 80 67   Resp: 18 20 15    Temp: 98.8 F (37.1 C) 98.8 F (37.1 C) 98.8 F (37.1 C) 98.2 F (36.8 C)  TempSrc: Oral Oral Oral Oral  SpO2: 98% 95% 97%   Weight:   102 kg   Height:        Intake/Output Summary (Last 24 hours) at 06/21/2023 1045 Last data filed at 06/21/2023 1000 Gross per 24 hour  Intake 240 ml  Output 3400 ml  Net -3160 ml       06/21/2023    5:31 AM 06/20/2023    5:10 AM 06/19/2023    5:58 AM  Last 3 Weights  Weight (lbs) 224 lb 13.9 oz 225 lb 12 oz 228 lb 2.8 oz  Weight (kg) 102 kg 102.4 kg 103.5 kg     Telemetry    NSR occ PVCs, one episode of AV block at ~630 AM 2.68sec pause (see media for photo) - Personally Reviewed  Physical Exam   GEN: No acute distress.  HEENT: Normocephalic, atraumatic, sclera non-icteric. Neck: No JVD or bruits. Cardiac: RRR no murmurs, rubs, or gallops.  Respiratory: Clear to auscultation bilaterally. Breathing is unlabored. GI: Soft, nontender, non-distended, BS +x 4. MS: no deformity. Extremities: No clubbing or cyanosis. Mild LE edema with striations  indicating higher level of edema in the past. Distal pedal pulses are 2+ and equal bilaterally. Neuro:  AAOx3. Follows commands. Psych:  Responds to questions appropriately with a normal affect.  Labs    High Sensitivity Troponin:  No results for input(s): "TROPONINIHS" in the last 720 hours.    Cardiac EnzymesNo results for input(s): "TROPONINI" in the last 168 hours. No results for input(s): "TROPIPOC" in the last 168 hours.   Chemistry Recent Labs  Lab 06/19/23 0152 06/20/23 0328 06/21/23 0247 06/21/23 0931  NA 136 137 136 137  K 3.3* 3.6 3.7 3.8  CL 101 100 98 94*  CO2 25 29 30 28   GLUCOSE 85 93 86 94  BUN 14 18 17 15   CREATININE 0.82 0.85 0.81 0.75  CALCIUM 8.8* 8.8* 8.8* 9.3  PROT 6.3*  --  5.8*  --   ALBUMIN 3.0*  --  2.6*  --   AST 28  --  44*  --   ALT 27  --  35  --   ALKPHOS 94  --  84  --   BILITOT 0.7  --  0.4  --   GFRNONAA >60 >60 >60 >60  ANIONGAP 10 8 8 15       Hematology Recent Labs  Lab 06/19/23 0048 06/21/23 0247  WBC 5.7 5.3  RBC 3.21* 3.07*  HGB 10.3* 9.7*  HCT 31.4* 29.8*  MCV 97.8 97.1  MCH 32.1 31.6  MCHC 32.8 32.6  RDW 15.7* 15.3  PLT 316 257     BNP Recent Labs  Lab 06/19/23 0048  BNP 76.5      DDimer No results for input(s): "DDIMER" in the last 168 hours.   Radiology    ECHOCARDIOGRAM COMPLETE  Result Date: 06/20/2023    ECHOCARDIOGRAM REPORT   Patient Name:   Traci Meyer Date of Exam: 06/20/2023 Medical Rec #:  [id number]        Height:       63.0 in Accession #:    [id number]       Weight:       225.7 lb Date of Birth:  July 08, 1931        BSA:          2.036 m Patient Age:    89 years         BP:           127/66 mmHg Patient Gender: F                HR:           67 bpm. Exam Location:  Inpatient Procedure: 2D Echo, Cardiac Doppler, Color Doppler and Intracardiac            Opacification Agent Indications:    CHF-Acute Diastolic I50.31  History:        Patient has prior history of Echocardiogram examinations, most                  recent 08/02/2017. CHF, Arrythmias:Atrial Fibrillation; Risk                 Factors:Hypertension, Dyslipidemia and Former Smoker.  Sonographer:    Dondra Prader RVT RCS Referring Phys: [phone] RONDELL A SMITH  Sonographer Comments: Technically challenging study due to limited acoustic windows, Technically difficult study due to poor echo windows, suboptimal parasternal window, suboptimal apical window, suboptimal subcostal window and patient is obese. Image acquisition challenging due to patient body habitus. IMPRESSIONS  1. Left ventricular ejection fraction, by estimation, is 60 to 65%. The left ventricle has normal function. The left ventricle has no regional wall motion abnormalities. There is moderate asymmetric left ventricular hypertrophy of the basal-septal segment. Left ventricular diastolic parameters are indeterminate.  2. Right ventricular systolic function is normal. The right ventricular size is mildly enlarged. There is normal pulmonary artery systolic pressure.  3. Left atrial size was moderately dilated.  4. Right atrial size was mildly dilated.  5. The mitral valve is grossly normal. Trivial mitral valve regurgitation. No evidence of mitral stenosis.  6. The aortic valve is tricuspid. There is mild calcification of the aortic valve. There is mild thickening of the aortic valve. Aortic valve regurgitation is trivial. Aortic valve sclerosis/calcification is present, without any evidence of aortic stenosis.  7. Pulmonic valve regurgitation is moderate.  8. Aortic dilatation noted. There is mild dilatation of the ascending aorta, measuring 40 mm.  9. The inferior vena cava is normal in size with greater than 50% respiratory variability, suggesting right atrial pressure of 3 mmHg. Comparison(s): No significant change from prior study. Conclusion(s)/Recommendation(s): Otherwise normal echocardiogram, with minor abnormalities described in the report. FINDINGS  Left Ventricle: Left ventricular  ejection fraction, by estimation, is 60 to 65%. The left ventricle has normal function. The  left ventricle has no regional wall motion abnormalities. Definity contrast agent was given IV to delineate the left ventricular  endocardial borders. The left ventricular internal cavity size was normal in size. There is moderate asymmetric left ventricular hypertrophy of the basal-septal segment. Left ventricular diastolic parameters are indeterminate. Right Ventricle: The right ventricular size is mildly enlarged. Right vetricular wall thickness was not well visualized. Right ventricular systolic function is normal. There is normal pulmonary artery systolic pressure. The tricuspid regurgitant velocity  is 1.40 m/s, and with an assumed right atrial pressure of 3 mmHg, the estimated right ventricular systolic pressure is 10.8 mmHg. Left Atrium: Left atrial size was moderately dilated. Right Atrium: Right atrial size was mildly dilated. Pericardium: There is no evidence of pericardial effusion. Mitral Valve: The mitral valve is grossly normal. Trivial mitral valve regurgitation. No evidence of mitral valve stenosis. Tricuspid Valve: The tricuspid valve is grossly normal. Tricuspid valve regurgitation is trivial. No evidence of tricuspid stenosis. Aortic Valve: The aortic valve is tricuspid. There is mild calcification of the aortic valve. There is mild thickening of the aortic valve. Aortic valve regurgitation is trivial. Aortic valve sclerosis/calcification is present, without any evidence of aortic stenosis. Aortic valve mean gradient measures 5.0 mmHg. Aortic valve peak gradient measures 10.0 mmHg. Aortic valve area, by VTI measures 2.56 cm. Pulmonic Valve: The pulmonic valve was not well visualized. Pulmonic valve regurgitation is moderate. No evidence of pulmonic stenosis. Aorta: Aortic dilatation noted. There is mild dilatation of the ascending aorta, measuring 40 mm. Venous: The inferior vena cava is normal in size  with greater than 50% respiratory variability, suggesting right atrial pressure of 3 mmHg. IAS/Shunts: The interatrial septum was not well visualized.  LEFT VENTRICLE PLAX 2D LVIDd:         4.10 cm   Diastology LVIDs:         2.70 cm   LV e' medial:    5.93 cm/s LV PW:         1.10 cm   LV E/e' medial:  11.2 LV IVS:        1.60 cm   LV e' lateral:   8.85 cm/s LVOT diam:     2.00 cm   LV E/e' lateral: 7.5 LV SV:         87 LV SV Index:   43 LVOT Area:     3.14 cm  RIGHT VENTRICLE             IVC RV Basal diam:  4.20 cm     IVC diam: 1.90 cm RV Mid diam:    3.20 cm RV S prime:     17.40 cm/s TAPSE (M-mode): 2.5 cm LEFT ATRIUM             Index        RIGHT ATRIUM           Index LA diam:        2.90 cm 1.42 cm/m   RA Area:     14.60 cm LA Vol (A2C):   98.2 ml 48.24 ml/m  RA Volume:   42.20 ml  20.73 ml/m LA Vol (A4C):   80.8 ml 39.69 ml/m LA Biplane Vol: 89.4 ml 43.91 ml/m  AORTIC VALVE                     PULMONIC VALVE AV Area (Vmax):    2.57 cm      PV Vmax:  1.09 m/s AV Area (Vmean):   2.49 cm      PV Peak grad:     4.8 mmHg AV Area (VTI):     2.56 cm      PR End Diast Vel: 6.86 msec AV Vmax:           158.50 cm/s AV Vmean:          101.500 cm/s AV VTI:            0.338 m AV Peak Grad:      10.0 mmHg AV Mean Grad:      5.0 mmHg LVOT Vmax:         129.50 cm/s LVOT Vmean:        80.400 cm/s LVOT VTI:          0.276 m LVOT/AV VTI ratio: 0.82  AORTA Ao Root diam: 2.80 cm Ao Asc diam:  4.00 cm MITRAL VALVE               TRICUSPID VALVE MV Area (PHT): 3.63 cm    TR Peak grad:   7.8 mmHg MV Decel Time: 209 msec    TR Vmax:        140.00 cm/s MV E velocity: 66.30 cm/s MV A velocity: 79.20 cm/s  SHUNTS MV E/A ratio:  0.84        Systemic VTI:  0.28 m                            Systemic Diam: 2.00 cm Jodelle Red MD Electronically signed by Jodelle Red MD Signature Date/Time: 06/20/2023/10:37:53 AM    Final     Cardiac Studies   2d echo 06/20/23   1. Left ventricular ejection  fraction, by estimation, is 60 to 65%. The  left ventricle has normal function. The left ventricle has no regional  wall motion abnormalities. There is moderate asymmetric left ventricular  hypertrophy of the basal-septal  segment. Left ventricular diastolic parameters are indeterminate.   2. Right ventricular systolic function is normal. The right ventricular  size is mildly enlarged. There is normal pulmonary artery systolic  pressure.   3. Left atrial size was moderately dilated.   4. Right atrial size was mildly dilated.   5. The mitral valve is grossly normal. Trivial mitral valve  regurgitation. No evidence of mitral stenosis.   6. The aortic valve is tricuspid. There is mild calcification of the  aortic valve. There is mild thickening of the aortic valve. Aortic valve  regurgitation is trivial. Aortic valve sclerosis/calcification is present,  without any evidence of aortic  stenosis.   7. Pulmonic valve regurgitation is moderate.   8. Aortic dilatation noted. There is mild dilatation of the ascending  aorta, measuring 40 mm.   9. The inferior vena cava is normal in size with greater than 50%  respiratory variability, suggesting right atrial pressure of 3 mmHg.   Comparison(s): No significant change from prior study.   Patient Profile     89 y.o. female with hypertension, hyperlipidemia, ?chronic HFpEF, hypothyroidism, aortic atherosclerosis, arthritis, obesity, wheel chair bound, recently moved to [place] to live with her daughter. Ran out of her meds 3 weeks ago. Cardiology following for CHF given edema and vascular congestion on CXR.  Assessment & Plan    1. Acute on chronic HFpEF, moderate PR, mild dilation of ascending aorta - 2D echo EF 60-65%, moderate asymmetric BSH, mildly enlarged RV otherwise normal RV function,  moderate LAE, mild aortic sclerosis without stenosis, mild dilation of ascending aorta, moderate PR - tx with IV Lasix this admission with -4.4L thus far,  weight 228->224 -> mild lightheadedness this AM without clear relationship to HR/BP, likely nearing transition to oral - BP controlled  2. Essential HTN - stable, managed in context above  3. AV block - see photo below - not clearly symptomatic, ? Relationship to hypothyroidism - avoid AVN blocking agents - will review recs with MD, no urgent need for PPM    4. Occasional PVCs - avoid AVN blocking agents given the above - recommend primary team keep K 4.0 or greater, Mg 2.0 or greater  Remainder of medical issues per IM to include - anemia - Hgb downtrending, last 9.7 - hypoalbuminemia - hypothyroidism with elevated TSH in setting of being out of meds - hypokalemia, last 3.7  Remainder of medical issues per IM Tentatively went ahead and made f/u 7/8 with Virgel Bouquet APP  For questions or updates, please contact [facility] Please consult [email or web address] for contact info under Cardiology/STEMI.  Signed, Laurann Montana, PA-C 06/21/2023, 10:45 AM    Personally seen and examined. Agree with APP above with the following comments:  Briefly 89 YO F with morbid obesity, acute on chronic diastolic HF, HTN, and hypothyroidism.  Her sx have greatly improved on diuretics and she is nearing euvolemia. Daughter is at bedside, PT moved her from [place] and they are working to get housing set up.  No CP, no SOB, no Palpitations, no syncope. Tele: AM heart block without symptoms  Would recommend  One more day of IV diuretics Outpatient follow up for ectopy and early am heart block, no PP indicated BNP tomorrow  Transition to PO tomorrow  Riley Lam, MD FASE [facility] Cardiologist [facility]  [address] [place], [address] [place], [place] [address] [phone]  12:35 PM

## 2023-06-21 NOTE — Evaluation (Addendum)
Occupational Therapy Evaluation and Discharge Patient Details Name: Traci Meyer MRN: [id number] DOB: January 27, 1931 Today's Date: 06/21/2023   History of Present Illness Pt is a 89 year old woman admitted on 6/21 from home with CHF exacerbation due to not having her Lasix. PMH: HTN, aortic atherosclerosis, HLD, hypothyroidism, arthritis, obesity.   Clinical Impression   Pt can typically transfer to and from her w/c and walks a few feet with assist into the bathroom as it is not accessible. She is assisted for bathing, dressing and toileting and all IADLs by her daughter. Pt manages her own meds and is independent in self feeding and grooms with set up. Pt appears close to her functional baseline in ADLs and demonstrated ability to perform bed mobility modified independently with increased time and to transfer with min guard assist for safety. VSS throughout. No further OT needs.      Recommendations for follow up therapy are one component of a multi-disciplinary discharge planning process, led by the attending physician.  Recommendations may be updated based on patient status, additional functional criteria and insurance authorization.   Assistance Recommended at Discharge Intermittent Supervision/Assistance  Patient can return home with the following A little help with walking and/or transfers;A lot of help with bathing/dressing/bathroom    Functional Status Assessment  Patient has not had a recent decline in their functional status  Equipment Recommendations  Wheelchair (measurements OT);Wheelchair cushion (measurements OT)    Recommendations for Other Services       Precautions / Restrictions Precautions Precautions: Fall      Mobility Bed Mobility Overal bed mobility: Modified Independent             General bed mobility comments: increased time, HOB up    Transfers Overall transfer level: Needs assistance   Transfers: Bed to chair/wheelchair/BSC       Step  pivot transfers: Min guard     General transfer comment: chair at 45 degrees from bed to simulate home, increased time, min guard assist for safety and lines      Balance Overall balance assessment: Needs assistance   Sitting balance-Leahy Scale: Fair       Standing balance-Leahy Scale: Poor                             ADL either performed or assessed with clinical judgement   ADL Overall ADL's : At baseline                                             Vision Patient Visual Report: Blurring of vision Additional Comments: reports she probably needs glasses     Perception     Praxis      Pertinent Vitals/Pain Pain Assessment Pain Assessment: Faces Faces Pain Scale: Hurts a little bit Pain Location: R hip Pain Descriptors / Indicators: Discomfort Pain Intervention(s): Monitored during session     Hand Dominance Right   Extremity/Trunk Assessment Upper Extremity Assessment Upper Extremity Assessment: Defer to OT evaluation   Lower Extremity Assessment Lower Extremity Assessment: RLE deficits/detail;LLE deficits/detail RLE Deficits / Details: AROM somwhat limited due to R hip pain, can lift antigravity with grimacing, knee extension strength 4/5, flexion ROM about 70 seated EOB LLE Deficits / Details: AROM knee flexion about 70-80, ankle WFL, strength 3/5 hip flexion 4/5 knee extension   Cervical /  Trunk Assessment Cervical / Trunk Assessment: Other exceptions (obesity)   Communication Communication Communication: No difficulties   Cognition Arousal/Alertness: Awake/alert Behavior During Therapy: WFL for tasks assessed/performed Overall Cognitive Status: Within Functional Limits for tasks assessed                                 General Comments: manages her own meds     General Comments  skin on LE's showing signs of recent edema; using purewick while on Lasix, uses depends at night at home, to bathroom during the  day    Exercises     Shoulder Instructions      Home Living Family/patient expects to be discharged to:: Private residence Living Arrangements: Children Available Help at Discharge: Family;Available 24 hours/day Type of Home: Apartment Home Access: Level entry     Home Layout: One level     Bathroom Shower/Tub: Sponge bathes at baseline   Bathroom Toilet: Standard (with BSC over) Bathroom Accessibility: No   Home Equipment: Print production planner (4 wheels)   Additional Comments: rollator and wheelchair in disrepair; planning to move to senior living apartment when can get one available and higher on the list per daughter      Prior Functioning/Environment Prior Level of Function : Needs assist             Mobility Comments: can transfer herself without AD, walks a few feet into bathroom with help ADLs Comments: daughter helps with sponge bathing, dressing, toileting and all IADLs        OT Problem List:        OT Treatment/Interventions:      OT Goals(Current goals can be found in the care plan section) Acute Rehab OT Goals OT Goal Formulation: With patient/family  OT Frequency:      Co-evaluation   Reason for Co-Treatment: To address functional/ADL transfers PT goals addressed during session: Mobility/safety with mobility;Balance        AM-PAC OT "6 Clicks" Daily Activity     Outcome Measure Help from another person eating meals?: None Help from another person taking care of personal grooming?: A Little Help from another person toileting, which includes using toliet, bedpan, or urinal?: A Lot Help from another person bathing (including washing, rinsing, drying)?: A Lot Help from another person to put on and taking off regular upper body clothing?: A Lot Help from another person to put on and taking off regular lower body clothing?: A Lot 6 Click Score: 15   End of Session Equipment Utilized During Treatment: Gait belt Nurse  Communication: Mobility status  Activity Tolerance: Patient tolerated treatment well Patient left: in chair;with call bell/phone within reach;with chair alarm set  OT Visit Diagnosis: Muscle weakness (generalized) (M62.81)                Time: 6045-4098 OT Time Calculation (min): 25 min Charges:  OT General Charges $OT Visit: 1 Visit OT Evaluation $OT Eval Moderate Complexity: 1 Mod Berna Spare, OTR/L Acute Rehabilitation Services Office: [phone]  Evern Bio 06/21/2023, 11:53 AM

## 2023-06-21 NOTE — Progress Notes (Signed)
Heart Failure Navigator Progress Note  Assessed for Heart & Vascular TOC clinic readiness.  Patient does not meet criteria due to EF 60-65%, Per MD No HF TOC had a [facility] appointment scheduled for 07/05/2023. .   Navigator will sign off at this time.    Rhae Hammock, BSN, Scientist, clinical (histocompatibility and immunogenetics) Only

## 2023-06-21 NOTE — Evaluation (Addendum)
Physical Therapy Evaluation Patient Details Name: Traci Meyer MRN: [id number] DOB: 04/10/1931 Today's Date: 06/21/2023  History of Present Illness  Pt is a 89 year old woman admitted on 6/21 from home with CHF exacerbation due to not having her Lasix. PMH: HTN, aortic atherosclerosis, HLD, hypothyroidism, arthritis, obesity.  Clinical Impression  Patient presents with mobility close to her baseline, though stiffness in legs and some generalized weakness from diuresis noted.  BP stable with mobility and pt able to step pivot to recliner with minguard A for safety and lines.  She gets around at home in her wheelchair at baseline.  She will continue to benefit from skilled PT in the acute setting to maximize mobility and strength for d/c home with family support and likely no follow up PT needs.  Does report her wheelchair is old and in disrepair.  Recommended new wheelchair for home.        Recommendations for follow up therapy are one component of a multi-disciplinary discharge planning process, led by the attending physician.  Recommendations may be updated based on patient status, additional functional criteria and insurance authorization.  Follow Up Recommendations       Assistance Recommended at Discharge Intermittent Supervision/Assistance  Patient can return home with the following  A little help with walking and/or transfers;A little help with bathing/dressing/bathroom;Assistance with cooking/housework;Help with stairs or ramp for entrance    Equipment Recommendations Wheelchair (measurements PT);Wheelchair cushion (measurements PT) (20x18" please if lightweight model available.)  Recommendations for Other Services       Functional Status Assessment Patient has had a recent decline in their functional status and demonstrates the ability to make significant improvements in function in a reasonable and predictable amount of time.     Precautions / Restrictions  Precautions Precautions: Fall      Mobility  Bed Mobility Overal bed mobility: Modified Independent             General bed mobility comments: increased time, HOB up    Transfers Overall transfer level: Needs assistance   Transfers: Bed to chair/wheelchair/BSC     Step pivot transfers: Min guard       General transfer comment: chair at 45 degrees from bed to simulate home, increased time, min guard assist for safety and lines    Ambulation/Gait                  Stairs            Wheelchair Mobility    Modified Rankin (Stroke Patients Only)       Balance Overall balance assessment: Needs assistance   Sitting balance-Leahy Scale: Fair       Standing balance-Leahy Scale: Poor Standing balance comment: holding chair rail for transfer                             Pertinent Vitals/Pain Pain Assessment Pain Assessment: Faces Faces Pain Scale: Hurts a little bit Pain Location: R hip Pain Descriptors / Indicators: Discomfort Pain Intervention(s): Monitored during session    Home Living Family/patient expects to be discharged to:: Private residence Living Arrangements: Children Available Help at Discharge: Family;Available 24 hours/day Type of Home: Apartment Home Access: Level entry       Home Layout: One level Home Equipment: Wheelchair - Counsellor (4 wheels) Additional Comments: rollator and wheelchair in disrepair; planning to move to senior living apartment when can get one available and higher on the list per  daughter    Prior Function Prior Level of Function : Needs assist             Mobility Comments: can transfer herself without AD, walks a few feet into bathroom with help ADLs Comments: daughter helps with sponge bathing, dressing, toileting and all IADLs     Hand Dominance   Dominant Hand: Right    Extremity/Trunk Assessment   Upper Extremity Assessment Upper Extremity Assessment: Defer  to OT evaluation    Lower Extremity Assessment Lower Extremity Assessment: RLE deficits/detail;LLE deficits/detail RLE Deficits / Details: AROM somwhat limited due to R hip pain, can lift antigravity with grimacing, knee extension strength 4/5, flexion ROM about 70 seated EOB LLE Deficits / Details: AROM knee flexion about 70-80, ankle WFL, strength 3/5 hip flexion 4/5 knee extension    Cervical / Trunk Assessment Cervical / Trunk Assessment: Other exceptions (obesity)  Communication   Communication: No difficulties  Cognition Arousal/Alertness: Awake/alert Behavior During Therapy: WFL for tasks assessed/performed Overall Cognitive Status: Within Functional Limits for tasks assessed                                 General Comments: manages her own meds        General Comments General comments (skin integrity, edema, etc.): skin on LE's showing signs of recent edema; using purewick while on Lasix, uses depends at night at home, to bathroom during the day    Exercises General Exercises - Lower Extremity Ankle Circles/Pumps: AROM, Both, 5 reps, Supine Heel Slides: AROM, Both, 5 reps, Supine   Assessment/Plan    PT Assessment Patient needs continued PT services  PT Problem List Decreased activity tolerance;Decreased mobility;Decreased strength       PT Treatment Interventions DME instruction;Balance training;Functional mobility training;Patient/family education;Therapeutic activities;Therapeutic exercise;Wheelchair mobility training    PT Goals (Current goals can be found in the Care Plan section)  Acute Rehab PT Goals Patient Stated Goal: per daughter to get into senior living apartment PT Goal Formulation: With patient/family Time For Goal Achievement: 07/05/23 Potential to Achieve Goals: Good Additional Goals Additional Goal #1: patient to propel wheelchair 50' in home environment with S.    Frequency Min 1X/week     Co-evaluation PT/OT/SLP  Co-Evaluation/Treatment: Yes Reason for Co-Treatment: To address functional/ADL transfers PT goals addressed during session: Mobility/safety with mobility;Balance         AM-PAC PT "6 Clicks" Mobility  Outcome Measure Help needed turning from your back to your side while in a flat bed without using bedrails?: A Little Help needed moving from lying on your back to sitting on the side of a flat bed without using bedrails?: A Little Help needed moving to and from a bed to a chair (including a wheelchair)?: A Little Help needed standing up from a chair using your arms (e.g., wheelchair or bedside chair)?: A Little Help needed to walk in hospital room?: Total Help needed climbing 3-5 steps with a railing? : Total 6 Click Score: 14    End of Session Equipment Utilized During Treatment: Gait belt Activity Tolerance: Patient tolerated treatment well Patient left: in chair;with family/visitor present;with call bell/phone within reach;with chair alarm set   PT Visit Diagnosis: Muscle weakness (generalized) (M62.81);Difficulty in walking, not elsewhere classified (R26.2)    Time: 1610-9604 PT Time Calculation (min) (ACUTE ONLY): 25 min   Charges:   PT Evaluation $PT Eval Moderate Complexity: 1 Mod  Sheran Lawless, PT Acute Rehabilitation Services [phone] 06/21/2023   Elray Mcgregor 06/21/2023, 11:49 AM

## 2023-06-21 NOTE — TOC Initial Note (Addendum)
Transition of Care [facility]) - Initial/Assessment Note    Patient Details  Name: Traci Meyer MRN: [id number] Date of Birth: Oct 18, 1931  [facility]) CM/SW Contact:    Ronny Bacon, RN Phone Number: 06/21/2023, 1:57 PM  Clinical Narrative:   Spoke with patient at bedside. Patient recently moved here and lives with her daughter. Patient has 3 daughters in the area. Patient reports that she had a RW and a wheelchair, her rolling walker was left out for long period of time from the last time she was here and is now rusted. Patient also reports that the seat of her wheelchair is broken.  Patient feels she's unable to get into her daughter's cars, they both are too high for her. Patient may need transportation assistance to get home when ready for discharge. Rolling walker ordered from [employer], confirmed will send one to the room prior to discharge.        Expected Discharge Plan: Home w Home Health Services Barriers to Discharge: Continued Medical Work up   Patient Goals and CMS Choice Patient states their goals for this hospitalization and ongoing recovery are:: to go home          Expected Discharge Plan and Services   Discharge Planning Services: CM Consult   Living arrangements for the past 2 months: Single Family Home                                      Prior Living Arrangements/Services Living arrangements for the past 2 months: Single Family Home Lives with:: Adult Children Patient language and need for interpreter reviewed:: Yes Do you feel safe going back to the place where you live?: Yes      Need for Family Participation in Patient Care: Yes (Comment) Care giver support system in place?: Yes (comment) Current home services: DME (Rusted walker, wheelchair but seat is broken) Criminal Activity/Legal Involvement Pertinent to Current Situation/Hospitalization: No - Comment as needed  Activities of Daily Living Home Assistive  Devices/Equipment: Other (Comment) (wheelchair) ADL Screening (condition at time of admission) Patient's cognitive ability adequate to safely complete daily activities?: Yes Is the patient deaf or have difficulty hearing?: No Does the patient have difficulty seeing, even when wearing glasses/contacts?: No Does the patient have difficulty concentrating, remembering, or making decisions?: No Patient able to express need for assistance with ADLs?: Yes Does the patient have difficulty dressing or bathing?: Yes Independently performs ADLs?: No Communication: Independent Dressing (OT): Appropriate for developmental age Grooming: Appropriate for developmental age Feeding: Independent Bathing: Appropriate for developmental age Toileting: Appropriate for developmental age In/Out Bed: Appropriate for developmental age 90 in Home: Appropriate for developmental age Does the patient have difficulty walking or climbing stairs?: Yes Weakness of Legs: Both Weakness of Arms/Hands: Both  Permission Sought/Granted Permission sought to share information with : Case Manager, Family Supports, Magazine features editor    Share Information with NAME: Marlane Mingle or Calla Kicks     Permission granted to share info w Relationship: Daughters     Emotional Assessment Appearance:: Appears stated age Attitude/Demeanor/Rapport: Engaged Affect (typically observed): Appropriate Orientation: : Oriented to Self, Oriented to Place, Oriented to  Time Alcohol / Substance Use: Not Applicable Psych Involvement: No (comment)  Admission diagnosis:  CHF exacerbation (HCC) [I50.9] Patient Active Problem List   Diagnosis Date Noted   CHF exacerbation (HCC) 06/19/2023   Hypothyroidism 06/19/2023   Normocytic  anemia 06/19/2023   Hyperlipidemia 06/19/2023   Obesity, Class III, BMI 40-49.9 (morbid obesity) (HCC) 06/19/2023   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 [place] [address] Phone: [phone] Fax: [phone]  [id number] [place], [place] - [address] [place] [address] Phone: [phone] Fax: [phone]     Social Determinants of Health (SDOH) Social History: SDOH Screenings   Food Insecurity: No Food Insecurity (06/19/2023)  Housing: Low Risk  (06/19/2023)  Transportation Needs: Unmet Transportation Needs (06/19/2023)  Utilities: Not At Risk (06/19/2023)  Tobacco Use: Medium Risk (06/19/2023)   SDOH Interventions:     Readmission Risk Interventions     No data to display

## 2023-06-21 NOTE — Plan of Care (Signed)

## 2023-06-21 NOTE — Accreditation Note (Addendum)
Note type entered in error. ° °

## 2023-06-21 NOTE — TOC Progression Note (Signed)
Transition of Care [facility]) - Progression Note    Patient Details  Name: Traci Meyer MRN: [id number] Date of Birth: November 27, 1931  [facility]) CM/SW Contact  Mearl Latin, LCSW Phone Number: 06/21/2023, 4:37 PM  Clinical Narrative:    CSW received request to speak with patient's daughter regarding senior housing. CSW spoke with Traci Meyer on the room phone as her phone is turned off at this time. CSW emailed her senior apartment resources ([email or web address]). She stated patient saw Traci Meyer in [facility] but that now they live here so she will need a new PCP (she has also seen Traci Meyer in the past). Patient's CAPS worker is Traci Meyer [phone]). She requested PTAR home for patient at sister's apartment (will be going to [address] [employer]. [place]). She provided her daughter in law's phone number, Traci Meyer [phone] in the event daughter can't be reached.    Expected Discharge Plan: Home w Home Health Services Barriers to Discharge: Continued Medical Work up  Expected Discharge Plan and Services   Discharge Planning Services: CM Consult   Living arrangements for the past 2 months: Single Family Home                                       Social Determinants of Health (SDOH) Interventions SDOH Screenings   Food Insecurity: No Food Insecurity (06/19/2023)  Housing: Low Risk  (06/19/2023)  Transportation Needs: Unmet Transportation Needs (06/19/2023)  Utilities: Not At Risk (06/19/2023)  Tobacco Use: Medium Risk (06/19/2023)    Readmission Risk Interventions     No data to display

## 2023-06-22 ENCOUNTER — Other Ambulatory Visit (HOSPITAL_COMMUNITY): Payer: Self-pay

## 2023-06-22 DIAGNOSIS — I5033 Acute on chronic diastolic (congestive) heart failure: Secondary | ICD-10-CM | POA: Diagnosis not present

## 2023-06-22 LAB — CBC
HCT: 29.7 % — ABNORMAL LOW (ref 36.0–46.0)
Hemoglobin: 9.7 g/dL — ABNORMAL LOW (ref 12.0–15.0)
MCH: 32 pg (ref 26.0–34.0)
MCHC: 32.7 g/dL (ref 30.0–36.0)
MCV: 98 fL (ref 80.0–100.0)
Platelets: 257 10*3/uL (ref 150–400)
RBC: 3.03 MIL/uL — ABNORMAL LOW (ref 3.87–5.11)
RDW: 15.3 % (ref 11.5–15.5)
WBC: 5.5 10*3/uL (ref 4.0–10.5)
nRBC: 0 % (ref 0.0–0.2)

## 2023-06-22 LAB — BASIC METABOLIC PANEL
Anion gap: 10 (ref 5–15)
BUN: 17 mg/dL (ref 8–23)
CO2: 29 mmol/L (ref 22–32)
Calcium: 8.9 mg/dL (ref 8.9–10.3)
Chloride: 96 mmol/L — ABNORMAL LOW (ref 98–111)
Creatinine, Ser: 0.84 mg/dL (ref 0.44–1.00)
GFR, Estimated: >60 mL/min (ref >60)
Glucose, Bld: 91 mg/dL (ref 70–99)
Potassium: 3.6 mmol/L (ref 3.5–5.1)
Sodium: 135 mmol/L (ref 135–145)

## 2023-06-22 LAB — BRAIN NATRIURETIC PEPTIDE: B Natriuretic Peptide: 94.2 pg/mL (ref 0.0–100.0)

## 2023-06-22 MED ORDER — LEVOTHYROXINE SODIUM 100 MCG PO TABS
100.0000 ug | ORAL_TABLET | Freq: Every day | ORAL | 0 refills | Status: DC
  Filled 2023-06-22: qty 30, 30d supply, fill #0

## 2023-06-22 MED ORDER — METOPROLOL SUCCINATE ER 25 MG PO TB24
25.0000 mg | ORAL_TABLET | Freq: Every day | ORAL | 0 refills | Status: DC
  Filled 2023-06-22: qty 30, 30d supply, fill #0

## 2023-06-22 MED ORDER — METOPROLOL SUCCINATE ER 25 MG PO TB24: 25.0000 mg | ORAL_TABLET | Freq: Every day | ORAL | 0 refills | Status: DC

## 2023-06-22 MED ORDER — FUROSEMIDE 40 MG PO TABS: 40.0000 mg | ORAL_TABLET | Freq: Every day | ORAL | 1 refills | Status: DC

## 2023-06-22 MED ORDER — SPIRONOLACTONE 25 MG PO TABS: 25.0000 mg | ORAL_TABLET | Freq: Every day | ORAL | 0 refills | Status: DC

## 2023-06-22 MED ORDER — SPIRONOLACTONE 25 MG PO TABS
25.0000 mg | ORAL_TABLET | Freq: Every day | ORAL | 0 refills | Status: DC
  Filled 2023-06-22: qty 30, 30d supply, fill #0

## 2023-06-22 MED ORDER — FUROSEMIDE 40 MG PO TABS
40.0000 mg | ORAL_TABLET | Freq: Every day | ORAL | 1 refills | Status: DC
  Filled 2023-06-22: qty 30, 30d supply, fill #0

## 2023-06-22 MED ORDER — FUROSEMIDE 40 MG PO TABS
40.0000 mg | ORAL_TABLET | Freq: Every day | ORAL | Status: DC
  Administered 2023-06-22: 40 mg via ORAL
  Filled 2023-06-22: qty 1

## 2023-06-22 NOTE — Plan of Care (Signed)

## 2023-06-22 NOTE — Discharge Summary (Signed)
Physician Discharge Summary  Traci Meyer ZOX:[id number] DOB: 07-15-1931 DOA: 06/18/2023  PCP: Pcp, No  Admit date: 06/18/2023 Discharge date: 06/22/2023  Time spent: 45 minutes  Recommendations for Outpatient Follow-up:  [facility] on 7/8  Discharge Diagnoses:  Principal Problem:   Acute on chronic diastolic heart failure (HCC) Active Problems:   Hypothyroidism   Hypokalemia   Hypertension   Normocytic anemia   Hyperlipidemia   Obesity, Class III, BMI 40-49.9 (morbid obesity) (HCC)   Discharge Condition: improved  Diet recommendation: low sodium  Filed Weights   06/20/23 0510 06/21/23 0531 06/22/23 0507  Weight: 102.4 kg 102 kg 100.3 kg    History of present illness:  89/F with history of chronic diastolic CHF, hypertension, dyslipidemia, hypothyroidism, arthritis, morbid obesity presented to the ED with worsening lower extremity swelling.  She is wheelchair-bound, recently moved to [place] to live with her daughter, ran out of meds 3 weeks ago, tried some of her daughters Lasix. -In the ER temp is 100.1, vital signs stable, hemoglobin 10, BNP 76, chest x-ray noted cardiomegaly and pulmonary vascular congestion and CHF  Hospital Course:   Acute on chronic diastolic CHF  -Ran out of meds including diuretics 3 weeks ago -Last echo 2018 with a EF 60-65%, grade 2 diastolic dysfunction -Cards following, repeat echo with preserved EF, RV is normal -diuresed with IV  lasix, volume status improved, changed to po, started aldactone and resumed Toprol at DC -Poor candidate for SGLT2i with obesity and limited mobility -[facility] care FU made   Hypothyroidism -Likely taking levothyroxine -TSH elevated 21.4, free T4 is normal -Restarted Synthroid 100 mcg, needs repeat TSH ion 6 weeks   Hypokalemia -Repleted   Essential hypertension -Soft, continue Lasix, stopped amlodipine and ACE   Normocytic anemia -Chronic, monitor   Morbid obesity BMI 40.42  kg/m  Discharge Exam: Vitals:   06/22/23 0507 06/22/23 0832  BP: 120/65 128/65  Pulse: 63 72  Resp: 17 18  Temp: 98 F (36.7 C) 98 F (36.7 C)  SpO2: 99% 98%   Gen: Awake, Alert, Oriented X 3,  HEENT: no JVD Lungs: Good air movement bilaterally, CTAB CVS: S1S2/RRR Abd: soft, Non tender, non distended, BS present Extremities: No edema Skin: no new rashes on exposed skin   Discharge Instructions   Discharge Instructions     Diet - low sodium heart healthy   Complete by: As directed    Increase activity slowly   Complete by: As directed       Allergies as of 06/22/2023       Reactions   Penicillins Other (See Comments)   Has patient had a PCN reaction causing immediate rash, facial/tongue/throat swelling, SOB or lightheadedness with hypotension: Yes        Medication List     STOP taking these medications    amLODipine 5 MG tablet Commonly known as: NORVASC   enalapril 20 MG tablet Commonly known as: VASOTEC   ibuprofen 200 MG tablet Commonly known as: ADVIL   IBUPROFEN-DIPHENHYDRAMINE HCL PO   meloxicam 15 MG tablet Commonly known as: MOBIC       TAKE these medications    acetaminophen 500 MG tablet Commonly known as: TYLENOL Take 1,000 mg by mouth every 6 (six) hours as needed for mild pain.   diphenhydrAMINE 25 mg capsule Commonly known as: BENADRYL Take 50 mg by mouth every 6 (six) hours as needed for itching or sleep.   furosemide 40 MG tablet Commonly known as: LASIX Take  1 tablet (40 mg total) by mouth daily. What changed: Another medication with the same name was removed. Continue taking this medication, and follow the directions you see here.   levothyroxine 100 MCG tablet Commonly known as: SYNTHROID Take 1 tablet (100 mcg total) by mouth daily before breakfast. What changed:  medication strength how much to take   metoprolol succinate 25 MG 24 hr tablet Commonly known as: TOPROL-XL Take 1 tablet (25 mg total) by mouth  daily.   oxybutynin 5 MG tablet Commonly known as: DITROPAN Take 5 mg by mouth daily.   rosuvastatin 10 MG tablet Commonly known as: CRESTOR Take 10 mg by mouth daily.   spironolactone 25 MG tablet Commonly known as: ALDACTONE Take 1 tablet (25 mg total) by mouth daily. Start taking on: June 23, 2023   traMADol 50 MG tablet Commonly known as: ULTRAM Take 1 tablet (50 mg total) by mouth every 6 (six) hours as needed.       Allergies  Allergen Reactions   Penicillins Other (See Comments)    Has patient had a PCN reaction causing immediate rash, facial/tongue/throat swelling, SOB or lightheadedness with hypotension: Yes    Follow-up Information     Gaston Islam., NP Follow up.   Specialty: Cardiology Why: [facility] - [address] location - cardiology follow-up on Monday July 05, 2023 10:55 AM (Arrive by 10:40 AM). Alden Server is one of our nurse practitioners that works with our heart team. Contact information: [address] [place] [address] [phone]                  The results of significant diagnostics from this hospitalization (including imaging, microbiology, ancillary and laboratory) are listed below for reference.    Significant Diagnostic Studies: ECHOCARDIOGRAM COMPLETE  Result Date: 06/20/2023    ECHOCARDIOGRAM REPORT   Patient Name:   Traci Meyer Date of Exam: 06/20/2023 Medical Rec #:  [id number]        Height:       63.0 in Accession #:    [id number]       Weight:       225.7 lb Date of Birth:  Oct 29, 1931        BSA:          2.036 m Patient Age:    89 years         BP:           127/66 mmHg Patient Gender: F                HR:           67 bpm. Exam Location:  Inpatient Procedure: 2D Echo, Cardiac Doppler, Color Doppler and Intracardiac            Opacification Agent Indications:    CHF-Acute Diastolic I50.31  History:        Patient has prior history of Echocardiogram examinations, most                 recent  08/02/2017. CHF, Arrythmias:Atrial Fibrillation; Risk                 Factors:Hypertension, Dyslipidemia and Former Smoker.  Sonographer:    Dondra Prader RVT RCS Referring Phys: [phone] RONDELL A SMITH  Sonographer Comments: Technically challenging study due to limited acoustic windows, Technically difficult study due to poor echo windows, suboptimal parasternal window, suboptimal apical window, suboptimal subcostal window and patient is obese. Image acquisition challenging  due to patient body habitus. IMPRESSIONS  1. Left ventricular ejection fraction, by estimation, is 60 to 65%. The left ventricle has normal function. The left ventricle has no regional wall motion abnormalities. There is moderate asymmetric left ventricular hypertrophy of the basal-septal segment. Left ventricular diastolic parameters are indeterminate.  2. Right ventricular systolic function is normal. The right ventricular size is mildly enlarged. There is normal pulmonary artery systolic pressure.  3. Left atrial size was moderately dilated.  4. Right atrial size was mildly dilated.  5. The mitral valve is grossly normal. Trivial mitral valve regurgitation. No evidence of mitral stenosis.  6. The aortic valve is tricuspid. There is mild calcification of the aortic valve. There is mild thickening of the aortic valve. Aortic valve regurgitation is trivial. Aortic valve sclerosis/calcification is present, without any evidence of aortic stenosis.  7. Pulmonic valve regurgitation is moderate.  8. Aortic dilatation noted. There is mild dilatation of the ascending aorta, measuring 40 mm.  9. The inferior vena cava is normal in size with greater than 50% respiratory variability, suggesting right atrial pressure of 3 mmHg. Comparison(s): No significant change from prior study. Conclusion(s)/Recommendation(s): Otherwise normal echocardiogram, with minor abnormalities described in the report. FINDINGS  Left Ventricle: Left ventricular ejection fraction, by  estimation, is 60 to 65%. The left ventricle has normal function. The left ventricle has no regional wall motion abnormalities. Definity contrast agent was given IV to delineate the left ventricular  endocardial borders. The left ventricular internal cavity size was normal in size. There is moderate asymmetric left ventricular hypertrophy of the basal-septal segment. Left ventricular diastolic parameters are indeterminate. Right Ventricle: The right ventricular size is mildly enlarged. Right vetricular wall thickness was not well visualized. Right ventricular systolic function is normal. There is normal pulmonary artery systolic pressure. The tricuspid regurgitant velocity  is 1.40 m/s, and with an assumed right atrial pressure of 3 mmHg, the estimated right ventricular systolic pressure is 10.8 mmHg. Left Atrium: Left atrial size was moderately dilated. Right Atrium: Right atrial size was mildly dilated. Pericardium: There is no evidence of pericardial effusion. Mitral Valve: The mitral valve is grossly normal. Trivial mitral valve regurgitation. No evidence of mitral valve stenosis. Tricuspid Valve: The tricuspid valve is grossly normal. Tricuspid valve regurgitation is trivial. No evidence of tricuspid stenosis. Aortic Valve: The aortic valve is tricuspid. There is mild calcification of the aortic valve. There is mild thickening of the aortic valve. Aortic valve regurgitation is trivial. Aortic valve sclerosis/calcification is present, without any evidence of aortic stenosis. Aortic valve mean gradient measures 5.0 mmHg. Aortic valve peak gradient measures 10.0 mmHg. Aortic valve area, by VTI measures 2.56 cm. Pulmonic Valve: The pulmonic valve was not well visualized. Pulmonic valve regurgitation is moderate. No evidence of pulmonic stenosis. Aorta: Aortic dilatation noted. There is mild dilatation of the ascending aorta, measuring 40 mm. Venous: The inferior vena cava is normal in size with greater than 50%  respiratory variability, suggesting right atrial pressure of 3 mmHg. IAS/Shunts: The interatrial septum was not well visualized.  LEFT VENTRICLE PLAX 2D LVIDd:         4.10 cm   Diastology LVIDs:         2.70 cm   LV e' medial:    5.93 cm/s LV PW:         1.10 cm   LV E/e' medial:  11.2 LV IVS:        1.60 cm   LV e' lateral:   8.85  cm/s LVOT diam:     2.00 cm   LV E/e' lateral: 7.5 LV SV:         87 LV SV Index:   43 LVOT Area:     3.14 cm  RIGHT VENTRICLE             IVC RV Basal diam:  4.20 cm     IVC diam: 1.90 cm RV Mid diam:    3.20 cm RV S prime:     17.40 cm/s TAPSE (M-mode): 2.5 cm LEFT ATRIUM             Index        RIGHT ATRIUM           Index LA diam:        2.90 cm 1.42 cm/m   RA Area:     14.60 cm LA Vol (A2C):   98.2 ml 48.24 ml/m  RA Volume:   42.20 ml  20.73 ml/m LA Vol (A4C):   80.8 ml 39.69 ml/m LA Biplane Vol: 89.4 ml 43.91 ml/m  AORTIC VALVE                     PULMONIC VALVE AV Area (Vmax):    2.57 cm      PV Vmax:          1.09 m/s AV Area (Vmean):   2.49 cm      PV Peak grad:     4.8 mmHg AV Area (VTI):     2.56 cm      PR End Diast Vel: 6.86 msec AV Vmax:           158.50 cm/s AV Vmean:          101.500 cm/s AV VTI:            0.338 m AV Peak Grad:      10.0 mmHg AV Mean Grad:      5.0 mmHg LVOT Vmax:         129.50 cm/s LVOT Vmean:        80.400 cm/s LVOT VTI:          0.276 m LVOT/AV VTI ratio: 0.82  AORTA Ao Root diam: 2.80 cm Ao Asc diam:  4.00 cm MITRAL VALVE               TRICUSPID VALVE MV Area (PHT): 3.63 cm    TR Peak grad:   7.8 mmHg MV Decel Time: 209 msec    TR Vmax:        140.00 cm/s MV E velocity: 66.30 cm/s MV A velocity: 79.20 cm/s  SHUNTS MV E/A ratio:  0.84        Systemic VTI:  0.28 m                            Systemic Diam: 2.00 cm Jodelle Red MD Electronically signed by Jodelle Red MD Signature Date/Time: 06/20/2023/10:37:53 AM    Final    DG Chest Port 1 View  Result Date: 06/19/2023 CLINICAL DATA:  Orthopnea. EXAM: PORTABLE CHEST 1  VIEW COMPARISON:  Radiographs 01/20/2018 FINDINGS: Stable cardiomegaly. Aortic atherosclerotic calcification. Pulmonary vascular congestion and interstitial coarsening. No focal consolidation, pleural effusion, or pneumothorax. No displaced rib fracture. IMPRESSION: Findings compatible with CHF. Electronically Signed   By: Minerva Fester M.D.   On: 06/19/2023 00:25    Microbiology: No results found for this or any previous  visit (from the past 240 hour(s)).   Labs: Basic Metabolic Panel: Recent Labs  Lab 06/19/23 0152 06/20/23 0328 06/21/23 0247 06/21/23 0931 06/22/23 0201  NA 136 137 136 137 135  K 3.3* 3.6 3.7 3.8 3.6  CL 101 100 98 94* 96*  CO2 25 29 30 28 29   GLUCOSE 85 93 86 94 91  BUN 14 18 17 15 17   CREATININE 0.82 0.85 0.81 0.75 0.84  CALCIUM 8.8* 8.8* 8.8* 9.3 8.9   Liver Function Tests: Recent Labs  Lab 06/19/23 0152 06/21/23 0247  AST 28 44*  ALT 27 35  ALKPHOS 94 84  BILITOT 0.7 0.4  PROT 6.3* 5.8*  ALBUMIN 3.0* 2.6*   No results for input(s): "LIPASE", "AMYLASE" in the last 168 hours. No results for input(s): "AMMONIA" in the last 168 hours. CBC: Recent Labs  Lab 06/19/23 0048 06/21/23 0247 06/22/23 0201  WBC 5.7 5.3 5.5  HGB 10.3* 9.7* 9.7*  HCT 31.4* 29.8* 29.7*  MCV 97.8 97.1 98.0  PLT 316 257 257   Cardiac Enzymes: No results for input(s): "CKTOTAL", "CKMB", "CKMBINDEX", "TROPONINI" in the last 168 hours. BNP: BNP (last 3 results) Recent Labs    06/19/23 0048 06/22/23 0201  BNP 76.5 94.2    ProBNP (last 3 results) No results for input(s): "PROBNP" in the last 8760 hours.  CBG: No results for input(s): "GLUCAP" in the last 168 hours.     Signed:  Zannie Cove MD.  [facility] 06/22/2023, 12:54 PM

## 2023-06-22 NOTE — Plan of Care (Signed)

## 2023-06-22 NOTE — TOC Transition Note (Addendum)
Transition of Care [facility] - CM/SW Discharge Note   Patient Details  Name: Traci Meyer MRN: [id number] Date of Birth: 1931-11-15  Transition of Care [facility]) CM/SW Contact:  Gala Lewandowsky, RN Phone Number: 06/22/2023, 2:55 PM   Clinical Narrative: Patient will need transportation home via [facility]. Address that patient will go to is [address] [place]  [address]. Case Manager confirmed above address with daughters Sheralyn Boatman and Camelia Eng [phone]. Both daughters will be in the home at the time of discharge. Daughters states patient has a Occupational hygienist at home. Daughter Camelia Eng is currently looking for a PCP for her mother. No further home needs identified at this time.    1511 just called [facility].  Final next level of care: Home/Self Care Barriers to Discharge: No Barriers Identified  Discharge Plan and Services Additional resources added to the After Visit Summary for     Discharge Planning Services: CM Consult   Social Determinants of Health (SDOH) Interventions SDOH Screenings   Food Insecurity: No Food Insecurity (06/19/2023)  Housing: Low Risk  (06/19/2023)  Transportation Needs: Unmet Transportation Needs (06/19/2023)  Utilities: Not At Risk (06/19/2023)  Tobacco Use: Medium Risk (06/19/2023)   Readmission Risk Interventions     No data to display

## 2023-06-22 NOTE — Progress Notes (Addendum)
Patient Name: Traci Meyer Date of Encounter: 06/22/2023 [facility] Cardiologist: Christell Constant, MD   Interval Summary  .    89 y.o. female with hypertension, hyperlipidemia, ?chronic HFpEF, hypothyroidism, aortic atherosclerosis, arthritis, obesity, wheel chair bound, recently moved to [place] to live with her daughter. Ran out of her meds 3 weeks ago. Cardiology following for CHF given edema and vascular congestion on CXR.   Today no chest pain or SOB. Thinks she may go home today.    Vital Signs .    Vitals:   06/21/23 1359 06/21/23 2050 06/22/23 0507 06/22/23 0832  BP: 117/63 123/76 120/65 128/65  Pulse:  75 63 72  Resp:  18 17 18   Temp: 98 F (36.7 C) 98.3 F (36.8 C) 98 F (36.7 C) 98 F (36.7 C)  TempSrc: Oral Oral Oral   SpO2:  97% 99% 98%  Weight:   100.3 kg   Height:        Intake/Output Summary (Last 24 hours) at 06/22/2023 0846 Last data filed at 06/22/2023 0800 Gross per 24 hour  Intake 360 ml  Output 3900 ml  Net -3540 ml      06/22/2023    5:07 AM 06/21/2023    5:31 AM 06/20/2023    5:10 AM  Last 3 Weights  Weight (lbs) 221 lb 1.9 oz 224 lb 13.9 oz 225 lb 12 oz  Weight (kg) 100.3 kg 102 kg 102.4 kg      Telemetry/ECG    SR occ PVC - Personally Reviewed  2d echo 06/20/23   1. Left ventricular ejection fraction, by estimation, is 60 to 65%. The  left ventricle has normal function. The left ventricle has no regional  wall motion abnormalities. There is moderate asymmetric left ventricular  hypertrophy of the basal-septal  segment. Left ventricular diastolic parameters are indeterminate.   2. Right ventricular systolic function is normal. The right ventricular  size is mildly enlarged. There is normal pulmonary artery systolic  pressure.   3. Left atrial size was moderately dilated.   4. Right atrial size was mildly dilated.   5. The mitral valve is grossly normal. Trivial mitral valve  regurgitation. No evidence of mitral  stenosis.   6. The aortic valve is tricuspid. There is mild calcification of the  aortic valve. There is mild thickening of the aortic valve. Aortic valve  regurgitation is trivial. Aortic valve sclerosis/calcification is present,  without any evidence of aortic  stenosis.   7. Pulmonic valve regurgitation is moderate.   8. Aortic dilatation noted. There is mild dilatation of the ascending  aorta, measuring 40 mm.   9. The inferior vena cava is normal in size with greater than 50%  respiratory variability, suggesting right atrial pressure of 3 mmHg.   Comparison(s): No significant change from prior study.     Physical Exam .   GEN: No acute distress.   Neck: No JVD Cardiac: RRR, no murmurs, rubs, or gallops.  Respiratory: Clear to auscultation bilaterally. GI: Soft, nontender, non-distended  MS: No edema  Assessment & Plan .     Acute on chronic HFpEF, moderate PR, mild dilatation of ascending aorta. --- 2D echo EF 60-65%, moderate asymmetric BSH, mildly enlarged RV otherwise normal RV function, moderate LAE, mild aortic sclerosis without stenosis, mild dilation of ascending aorta, moderate PR - tx with IV Lasix this admission with -7.9L thus far, weight 228->224-->220.6 -> mild lightheadedness this AM without clear relationship to HR/BP, likely nearing transition to  oral change to PO today if MD agrees - BP controlled and stable on lasix and aldactone --home lasix when she had medication was 60 mg daily    HTN --stable on lasix and aldactone  (home meds were amlodipine 5, vasotec 20 troprol XL 25    AV Block/PVCs  --avoid AVN blocking agents  stop home toprol --rare episodes and asymptomatic  Remainder of medical issues per IM to include - anemia - Hgb downtrending, last 9.7 but stable - hypoalbuminemia - hypothyroidism with elevated TSH in setting of being out of meds - hypokalemia, last 3.6   For questions or updates, please contact [facility] Please  consult [email or web address] for contact info under        Signed, Nada Boozer, NP   Personally seen and examined. Agree with APP above with the following comments: 89 Patient with chronic HFpEF improved with IV diuresis Patient notes no symptoms.  Leg swelling has almost completely resolved.  No CP, SOB, or palpitations.  Daugther not in room today.  Exam notable for non pitting edema.  Elderly female with morbid obesity.  Otherwise as above.  Tele: SR with occasional PVCs Would recommend  - transition to lasix 40 mg PO daily aldactone 25 mg PO daily - can resume metoprolol succinate 25 mg PO daily either at discharge or tomorrow - has not needed norvasc or ACEi for CP BP control - patient is 89 years old, we may stop her rosuvastatin as an outpatient or tomorrow if she is still here (would like to make sure she and family are ok with stopping aggressive secondary prevention)  Riley Lam, MD FASE [facility]  [address] [place], [address] [place], [place] [address] [phone]  12:46 PM

## 2023-07-04 NOTE — Progress Notes (Deleted)
Office Visit    Patient Name: Traci Meyer Date of Encounter: 07/04/2023  Primary Care Provider:  Pcp, No Primary Cardiologist:  Christell Constant, MD Primary Electrophysiologist: None   Past Medical History    Past Medical History:  Diagnosis Date   Aortic atherosclerosis (HCC)    Arthritis    Diastolic congestive heart failure (HCC)    Hypertension    Hypothyroidism    Past Surgical History:  Procedure Laterality Date   MULTIPLE TOOTH EXTRACTIONS      Allergies  Allergies  Allergen Reactions   Penicillins Other (See Comments)    Has patient had a PCN reaction causing immediate rash, facial/tongue/throat swelling, SOB or lightheadedness with hypotension: Yes     History of Present Illness    ANNAELIZABETH Meyer  is a 89 year old female with a PMH of HFpEF, aortic atherosclerosis, HLD, HTN, arthritis, obesity who presents today for posthospital follow-up.  Ms. Bostock was seen in the ED on 06/19/23 for complaint of worsening lower extremity edema and pain.  She reported being off of her medications for a period of time due to recent eviction and had been off her meds for 3 weeks.  She is wheelchair-bound.  2D echo was completed showing EF of 60 to 65% with no RWMA and moderate LVH with moderately dilated LA and mildly dilated RA with trivial MVR, trivial AVR, trivial PVR with mild dilation of ascending aorta of 40 mm.  Chest x-ray also showed vascular congestion and BNP was normal at 76.5.  She was treated with Lasix with good response and diuresis and improvement of symptoms.  She had a brief episode of AV block per telemetry and recommendation to avoid AV nodal blocking agents.  She was started on spironolactone and started on Toprol 25 mg daily at discharge.  Since last being seen in the office patient reports***.  Patient denies chest pain, palpitations, dyspnea, PND, orthopnea, nausea, vomiting, dizziness, syncope, edema, weight gain, or early  satiety.     ***Notes: -Not a good candidate for SGLT2 due to obesity and limited mobility Home Medications    Current Outpatient Medications  Medication Sig Dispense Refill   acetaminophen (TYLENOL) 500 MG tablet Take 1,000 mg by mouth every 6 (six) hours as needed for mild pain.     diphenhydrAMINE (BENADRYL) 25 mg capsule Take 50 mg by mouth every 6 (six) hours as needed for itching or sleep.     furosemide (LASIX) 40 MG tablet Take 1 tablet (40 mg total) by mouth daily. 30 tablet 1   levothyroxine (SYNTHROID) 100 MCG tablet Take 1 tablet (100 mcg total) by mouth daily before breakfast. 30 tablet 0   metoprolol succinate (TOPROL-XL) 25 MG 24 hr tablet Take 1 tablet (25 mg total) by mouth daily. 30 tablet 0   oxybutynin (DITROPAN) 5 MG tablet Take 5 mg by mouth daily.     rosuvastatin (CRESTOR) 10 MG tablet Take 10 mg by mouth daily.     spironolactone (ALDACTONE) 25 MG tablet Take 1 tablet (25 mg total) by mouth daily. 30 tablet 0   traMADol (ULTRAM) 50 MG tablet Take 1 tablet (50 mg total) by mouth every 6 (six) hours as needed. 15 tablet 0   No current facility-administered medications for this visit.     Review of Systems  Please see the history of present illness.    (+)*** (+)***  All other systems reviewed and are otherwise negative except as noted above.  Physical  Exam    Wt Readings from Last 3 Encounters:  06/22/23 221 lb 1.9 oz (100.3 kg)  03/20/18 200 lb (90.7 kg)  08/29/17 192 lb 9.6 oz (87.4 kg)   ZO:XWRUE were no vitals filed for this visit.,There is no height or weight on file to calculate BMI.  Constitutional:      Appearance: Healthy appearance. Not in distress.  Neck:     Vascular: JVD normal.  Pulmonary:     Effort: Pulmonary effort is normal.     Breath sounds: No wheezing. No rales. Diminished in the bases Cardiovascular:     Normal rate. Regular rhythm. Normal S1. Normal S2.      Murmurs: There is no murmur.  Edema:    Peripheral edema  absent.  Abdominal:     Palpations: Abdomen is soft non tender. There is no hepatomegaly.  Skin:    General: Skin is warm and dry.  Neurological:     General: No focal deficit present.     Mental Status: Alert and oriented to person, place and time.     Cranial Nerves: Cranial nerves are intact.  EKG/LABS/ Recent Cardiac Studies    ECG personally reviewed by me today - ***   Risk Assessment/Calculations:   {Does this patient have ATRIAL FIBRILLATION?:[phone]}        Lab Results  Component Value Date   WBC 5.5 06/22/2023   HGB 9.7 (L) 06/22/2023   HCT 29.7 (L) 06/22/2023   MCV 98.0 06/22/2023   PLT 257 06/22/2023   Lab Results  Component Value Date   CREATININE 0.84 06/22/2023   BUN 17 06/22/2023   NA 135 06/22/2023   K 3.6 06/22/2023   CL 96 (L) 06/22/2023   CO2 29 06/22/2023   Lab Results  Component Value Date   ALT 35 06/21/2023   AST 44 (H) 06/21/2023   ALKPHOS 84 06/21/2023   BILITOT 0.4 06/21/2023   No results found for: "CHOL", "HDL", "LDLCALC", "LDLDIRECT", "TRIG", "CHOLHDL"  No results found for: "HGBA1C"   Assessment & Plan    1.  HFpEF: -Patient recently admitted from 6/21 to 6/25 with acute on chronic CHF exacerbation brought on by missing medication for 3 weeks. -Today patient reports*** -Continue***  2.  Essential hypertension: -Patient's blood pressure today is*** -Continue***  3.  Hyperlipidemia: -Patient's LDL cholesterol was*** -Continue***  4.  Hypokalemia: -Patient's last potassium was***  5.  Morbid obesity: -Patient's BMI is*** -She is wheelchair-bound***      Disposition: Follow-up with Christell Constant, MD or APP in *** months {Are you ordering a CV Procedure (e.g. stress test, cath, DCCV, TEE, etc)?   Press F2        :[id number]}   Medication Adjustments/Labs and Tests Ordered: Current medicines are reviewed at length with the patient today.  Concerns regarding medicines are outlined above.   Signed, Napoleon Form, Leodis Rains, NP 07/04/2023, 2:24 PM [facility]

## 2023-07-05 ENCOUNTER — Ambulatory Visit: Payer: 59 | Attending: Nurse Practitioner | Admitting: Nurse Practitioner

## 2023-07-05 ENCOUNTER — Encounter: Payer: Self-pay | Admitting: Nurse Practitioner

## 2023-07-05 ENCOUNTER — Other Ambulatory Visit (HOSPITAL_COMMUNITY): Payer: Self-pay

## 2023-07-05 DIAGNOSIS — I10 Essential (primary) hypertension: Secondary | ICD-10-CM

## 2023-07-05 DIAGNOSIS — I5032 Chronic diastolic (congestive) heart failure: Secondary | ICD-10-CM

## 2023-07-05 DIAGNOSIS — E785 Hyperlipidemia, unspecified: Secondary | ICD-10-CM

## 2023-07-05 DIAGNOSIS — E876 Hypokalemia: Secondary | ICD-10-CM

## 2023-07-05 DIAGNOSIS — E039 Hypothyroidism, unspecified: Secondary | ICD-10-CM

## 2023-10-13 ENCOUNTER — Ambulatory Visit: Payer: 59 | Admitting: Family Medicine

## 2023-11-10 ENCOUNTER — Encounter (HOSPITAL_COMMUNITY): Payer: Self-pay

## 2023-11-10 ENCOUNTER — Ambulatory Visit (INDEPENDENT_AMBULATORY_CARE_PROVIDER_SITE_OTHER): Payer: 59 | Admitting: Family Medicine

## 2023-11-10 ENCOUNTER — Emergency Department (HOSPITAL_COMMUNITY)
Admission: EM | Admit: 2023-11-10 | Discharge: 2023-11-10 | Disposition: A | Discharge status: Home / Self Care | Payer: 59 | Attending: Emergency Medicine | Admitting: Emergency Medicine

## 2023-11-10 ENCOUNTER — Other Ambulatory Visit: Payer: Self-pay

## 2023-11-10 ENCOUNTER — Encounter: Payer: Self-pay | Admitting: Family Medicine

## 2023-11-10 VITALS — BP 150/90 | HR 73 | Temp 98.1°F | Wt 220.0 lb

## 2023-11-10 DIAGNOSIS — R609 Edema, unspecified: Secondary | ICD-10-CM | POA: Insufficient documentation

## 2023-11-10 DIAGNOSIS — I5032 Chronic diastolic (congestive) heart failure: Secondary | ICD-10-CM | POA: Diagnosis not present

## 2023-11-10 DIAGNOSIS — Z79899 Other long term (current) drug therapy: Secondary | ICD-10-CM | POA: Insufficient documentation

## 2023-11-10 DIAGNOSIS — Z7689 Persons encountering health services in other specified circumstances: Secondary | ICD-10-CM

## 2023-11-10 DIAGNOSIS — I700 Atherosclerosis of aorta: Secondary | ICD-10-CM

## 2023-11-10 DIAGNOSIS — E039 Hypothyroidism, unspecified: Secondary | ICD-10-CM

## 2023-11-10 DIAGNOSIS — L03119 Cellulitis of unspecified part of limb: Secondary | ICD-10-CM | POA: Diagnosis not present

## 2023-11-10 DIAGNOSIS — R600 Localized edema: Secondary | ICD-10-CM | POA: Insufficient documentation

## 2023-11-10 DIAGNOSIS — I10 Essential (primary) hypertension: Secondary | ICD-10-CM | POA: Insufficient documentation

## 2023-11-10 DIAGNOSIS — M7989 Other specified soft tissue disorders: Secondary | ICD-10-CM | POA: Insufficient documentation

## 2023-11-10 LAB — CBC WITH DIFFERENTIAL/PLATELET
Abs Immature Granulocytes: 0.02 10*3/uL (ref 0.00–0.07)
Basophils Absolute: 0.1 10*3/uL (ref 0.0–0.1)
Basophils Relative: 1 %
Eosinophils Absolute: 0.2 10*3/uL (ref 0.0–0.5)
Eosinophils Relative: 3 %
HCT: 33.8 % — ABNORMAL LOW (ref 36.0–46.0)
Hemoglobin: 10.7 g/dL — ABNORMAL LOW (ref 12.0–15.0)
Immature Granulocytes: 0 %
Lymphocytes Relative: 19 %
Lymphs Abs: 1.1 10*3/uL (ref 0.7–4.0)
MCH: 31.2 pg (ref 26.0–34.0)
MCHC: 31.7 g/dL (ref 30.0–36.0)
MCV: 98.5 fL (ref 80.0–100.0)
Monocytes Absolute: 0.5 10*3/uL (ref 0.1–1.0)
Monocytes Relative: 8 %
Neutro Abs: 4.2 10*3/uL (ref 1.7–7.7)
Neutrophils Relative %: 69 %
Platelets: 332 10*3/uL (ref 150–400)
RBC: 3.43 MIL/uL — ABNORMAL LOW (ref 3.87–5.11)
RDW: 15.6 % — ABNORMAL HIGH (ref 11.5–15.5)
WBC: 6.1 10*3/uL (ref 4.0–10.5)
nRBC: 0 % (ref 0.0–0.2)

## 2023-11-10 LAB — BASIC METABOLIC PANEL
Anion gap: 11 (ref 5–15)
BUN: 13 mg/dL (ref 8–23)
CO2: 25 mmol/L (ref 22–32)
Calcium: 9.9 mg/dL (ref 8.9–10.3)
Chloride: 104 mmol/L (ref 98–111)
Creatinine, Ser: 0.73 mg/dL (ref 0.44–1.00)
GFR, Estimated: >60 mL/min (ref >60)
Glucose, Bld: 96 mg/dL (ref 70–99)
Potassium: 3.9 mmol/L (ref 3.5–5.1)
Sodium: 140 mmol/L (ref 135–145)

## 2023-11-10 LAB — BRAIN NATRIURETIC PEPTIDE: B Natriuretic Peptide: 42.2 pg/mL (ref 0.0–100.0)

## 2023-11-10 MED ORDER — SPIRONOLACTONE 25 MG PO TABS: 25.0000 mg | ORAL_TABLET | Freq: Every day | ORAL | 5 refills | Status: DC

## 2023-11-10 MED ORDER — DOXYCYCLINE HYCLATE 100 MG PO TABS
100.0000 mg | ORAL_TABLET | Freq: Two times a day (BID) | ORAL | 0 refills | Status: AC
Start: 2023-11-10 — End: 2023-11-17

## 2023-11-10 MED ORDER — LEVOTHYROXINE SODIUM 100 MCG PO TABS: 100.0000 ug | ORAL_TABLET | Freq: Every day | ORAL | 5 refills | Status: DC

## 2023-11-10 MED ORDER — FUROSEMIDE 40 MG PO TABS: 40.0000 mg | ORAL_TABLET | Freq: Every day | ORAL | 4 refills | Status: DC

## 2023-11-10 MED ORDER — METOPROLOL SUCCINATE ER 25 MG PO TB24: 25.0000 mg | ORAL_TABLET | Freq: Every day | ORAL | 4 refills | Status: DC

## 2023-11-10 MED ORDER — FUROSEMIDE 10 MG/ML IJ SOLN
40.0000 mg | Freq: Once | INTRAMUSCULAR | Status: AC
  Administered 2023-11-10: 40 mg via INTRAVENOUS
  Filled 2023-11-10: qty 4

## 2023-11-10 NOTE — Discharge Instructions (Addendum)
Be sure to take medications daily, wear compression stockings, keep legs propped up, flex feet regularly in order to avoid worsening edema in the lower legs.  Return to the ER if symptoms worsen, fever, chest pain, shortness of breath, confusion, increased pain, skin becoming inflamed or red.

## 2023-11-10 NOTE — ED Provider Notes (Signed)
EMERGENCY DEPARTMENT AT [facility] Provider Note   CSN: [id number] Arrival date & time: 11/10/23  1116     History Chief Complaint  Patient presents with   Leg Swelling    Traci Meyer is a 89 y.o. female.  Presents with her daughter to the ER today for bilateral leg swelling that has been happening for the last 2 weeks and have become painful over the last 2 days.  Daughter states it has been months since she has taken medication but has given her some of her personal Lasix this week. Patient saw primary care today for establishing care visit. PCP prescribed spironolactone, Lasix, and metoprolol.  However the patient not yet picked up her medication and expresses poor compliance.  Patient is wheelchair-bound and does not sit with her legs propped up.  HPI     Home Medications Prior to Admission medications   Medication Sig Start Date End Date Taking? Authorizing Provider  acetaminophen (TYLENOL) 500 MG tablet Take 1,000 mg by mouth every 6 (six) hours as needed for mild pain. Patient not taking: Reported on 11/10/2023    [provider]  diphenhydrAMINE (BENADRYL) 25 mg capsule Take 50 mg by mouth every 6 (six) hours as needed for itching or sleep. Patient not taking: Reported on 11/10/2023    [provider]  doxycycline (VIBRA-TABS) 100 MG tablet Take 1 tablet (100 mg total) by mouth 2 (two) times daily for 7 days. 11/10/23 11/17/23  Ellender Hose, NP  furosemide (LASIX) 40 MG tablet Take 1 tablet (40 mg total) by mouth daily. 11/10/23   Ellender Hose, NP  levothyroxine (SYNTHROID) 100 MCG tablet Take 1 tablet (100 mcg total) by mouth daily before breakfast. 11/10/23   Ellender Hose, NP  metoprolol succinate (TOPROL-XL) 25 MG 24 hr tablet Take 1 tablet (25 mg total) by mouth daily. 11/10/23   Ellender Hose, NP  oxybutynin (DITROPAN) 5 MG tablet Take 5 mg by mouth daily. Patient not taking: Reported on 11/10/2023    [provider]   rosuvastatin (CRESTOR) 10 MG tablet Take 10 mg by mouth daily. Patient not taking: Reported on 11/10/2023    [provider]  spironolactone (ALDACTONE) 25 MG tablet Take 1 tablet (25 mg total) by mouth daily. 11/10/23   Ellender Hose, NP  traMADol (ULTRAM) 50 MG tablet Take 1 tablet (50 mg total) by mouth every 6 (six) hours as needed. Patient not taking: Reported on 11/10/2023 02/03/18   Rolan Bucco, MD      Allergies    Penicillins    Review of Systems   Review of Systems  Constitutional:  Negative for chills, fatigue and fever.  Respiratory:  Negative for cough, chest tightness, shortness of breath and wheezing.   Cardiovascular:  Positive for leg swelling. Negative for chest pain.  Gastrointestinal:  Negative for abdominal pain, nausea and vomiting.  Genitourinary:  Negative for dysuria.  Neurological:  Negative for dizziness, syncope, weakness and headaches.  Psychiatric/Behavioral:  Negative for confusion.     Physical Exam Updated Vital Signs BP (!) 149/77   Pulse 81   Temp 97.7 F (36.5 C) (Oral)   Resp 18   Ht 5\' 3"  (1.6 m)   Wt 99.8 kg   SpO2 99%   BMI 38.97 kg/m  Physical Exam Constitutional:      Appearance: Normal appearance.  Cardiovascular:     Rate and Rhythm: Normal rate and regular rhythm.  Pulmonary:     Effort: Pulmonary effort is  normal.     Breath sounds: Normal breath sounds.  Musculoskeletal:     Right lower leg: Edema present.     Left lower leg: Edema present.     Comments: Xeroderma present bilaterally.   Skin:    General: Skin is warm.  Neurological:     General: No focal deficit present.     Mental Status: She is alert and oriented to person, place, and time. Mental status is at baseline.     ED Results / Procedures / Treatments   Labs (all labs ordered are listed, but only abnormal results are displayed) Labs Reviewed  CBC WITH DIFFERENTIAL/PLATELET - Abnormal; Notable for the following components:      Result Value    RBC 3.43 (*)    Hemoglobin 10.7 (*)    HCT 33.8 (*)    RDW 15.6 (*)    All other components within normal limits  BASIC METABOLIC PANEL  BRAIN NATRIURETIC PEPTIDE    EKG EKG Interpretation Date/Time:  Wednesday November 10 2023 12:09:16 EST Ventricular Rate:  74 PR Interval:  262 QRS Duration:  86 QT Interval:  390 QTC Calculation: 432 R Axis:   -7  Text Interpretation: Sinus rhythm with marked sinus arrhythmia with 1st degree A-V block with occasional Premature ventricular complexes Minimal voltage criteria for LVH, may be normal variant ( R in aVL ) Borderline ECG Baseline wander TECHNICALLY DIFFICULT Otherwise no significant change Confirmed by Melene Plan [phone]) on 11/10/2023 4:08:41 PM  Radiology No results found.  Procedures Procedures    Medications Ordered in ED Medications  furosemide (LASIX) injection 40 mg (has no administration in time range)    ED Course/ Medical Decision Making/ A&P                                 Medical Decision Making Amount and/or Complexity of Data Reviewed Labs: ordered.     Patient presents to the ED for concern of bilateral lower leg swelling and leg pain., this involves an extensive number of treatment options, and is a complaint that carries with it a high risk of complications and morbidity.  The differential diagnosis includes lymphedema, DVT, worsening heart failure.   Co morbidities that complicate the patient evaluation  Advanced age Hypertension Hyperkalemia Wheelchair bound   Additional history obtained:  Additional history obtained from daughter present at bedside.  Outside Medical Records and Past Admission   External records from outside source obtained and reviewed including PCP note and previous ER visit.   Lab Tests:  I Ordered, and personally interpreted labs.  The pertinent results include:  RBC 3.43 Hemoglobin 10.7 Hematocrit 33.8  Imaging Studies ordered:  No imaging studies  necessary.  Cardiac Monitoring:  No cardiac monitoring necessary.   Medicines ordered and prescription drug management:  I ordered medication including Lasix for bilateral lower leg swelling. Reevaluation of the patient after these medicines showed that the patient improved I have reviewed the patients home medicines and have made adjustments as needed   Test Considered:  Lower leg ultrasound-considered if DVT were indicated. D-dimer-ordered if PE or DVT were indicated   Consultations Obtained:  I requested consultation with my attending,  and discussed lab and imaging findings as well as pertinent plan - they recommend: Continue with current plan of sending home with PCP prescribed medications as well as prescribing 3 days of Norco for pain management.   Problem List / ED Course:  Bilateral lower leg swelling -patient presents today with complaints of bilateral lower leg swelling and pain after seeing PCP earlier today.  Patient has not taken any of her prescribed medications for months and has not yet picked up her medications.  Patient was given Lasix while in the ER to help begin with diuresis and will be sent home with 3 days of Norco to help with pain management.  There are no indications of any emergent pathology is present.  After Lasix given patient reports improvement.  Will send home and instructed to pick up medications and take them consistently.  Will also instruct to lift legs while in wheelchair and to wear compression stocking to help to prevent further swelling.   Reevaluation:  After the interventions noted above, I reevaluated the patient and found that they have :improved   Social Determinants of Health:  Advanced age Poor medication compliance   Dispostion:  After consideration of the diagnostic results and the patients response to treatment, I feel that the patent would benefit from diuresis in ER and being sent home with prescribed medications  bribed by PCP as well as adding 3 days of pain management.  Alerted patient's of concerning symptoms to return to the ER.    Final Clinical Impression(s) / ED Diagnoses Final diagnoses:  Peripheral edema    Rx / DC Orders ED Discharge Orders     None         Lunette Stands, PA-C 11/10/23 1828    Melene Plan, DO 11/10/23 1842

## 2023-11-10 NOTE — Progress Notes (Unsigned)
I,Jameka J Llittleton, CMA,acting as a Neurosurgeon for [employer], NP.,have documented all relevant documentation on the behalf of Traci Hose, NP,as directed by  Traci Hose, NP while in the presence of Traci Hose, NP.  Subjective:  Patient ID: Traci Meyer , female    DOB: [birth year] , 89 y.o.   MRN: [id number]  Chief Complaint  Patient presents with   Establish Care   Edema    HPI  Patient  is a 89 year old female who presents today to establish primary care.  She presents today with her daughter for complaints of lower extremity swelling.  Daughter states that patient has not had her medicine, since she went to the emergency room in June 2024, and so she has been using her own medications.  Also she states that since they moved to the [place] area from [facility], that her mom has not had a good bed to sleep on.  Daughter states the patient has been sleeping on an ill fitting wheelchair day and night for the past few weeks making the swelling in her lower extremity worse.       Past Medical History:  Diagnosis Date   Aortic atherosclerosis (HCC)    Arthritis    Diastolic congestive heart failure (HCC)    Hypertension    Hypothyroidism      Family History  Problem Relation Age of Onset   Heart disease Neg Hx      Current Outpatient Medications:    acetaminophen (TYLENOL) 500 MG tablet, Take 1,000 mg by mouth every 6 (six) hours as needed for mild pain. (Patient not taking: Reported on 11/10/2023), Disp: , Rfl:    diphenhydrAMINE (BENADRYL) 25 mg capsule, Take 50 mg by mouth every 6 (six) hours as needed for itching or sleep. (Patient not taking: Reported on 11/10/2023), Disp: , Rfl:    furosemide (LASIX) 40 MG tablet, Take 1 tablet (40 mg total) by mouth daily., Disp: 30 tablet, Rfl: 4   levothyroxine (SYNTHROID) 100 MCG tablet, Take 1 tablet (100 mcg total) by mouth daily before breakfast., Disp: 30 tablet, Rfl: 5   metoprolol succinate (TOPROL-XL) 25 MG 24 hr  tablet, Take 1 tablet (25 mg total) by mouth daily., Disp: 30 tablet, Rfl: 4   oxybutynin (DITROPAN) 5 MG tablet, Take 5 mg by mouth daily. (Patient not taking: Reported on 11/10/2023), Disp: , Rfl:    rosuvastatin (CRESTOR) 10 MG tablet, Take 10 mg by mouth daily. (Patient not taking: Reported on 11/10/2023), Disp: , Rfl:    spironolactone (ALDACTONE) 25 MG tablet, Take 1 tablet (25 mg total) by mouth daily., Disp: 30 tablet, Rfl: 5   traMADol (ULTRAM) 50 MG tablet, Take 1 tablet (50 mg total) by mouth every 6 (six) hours as needed. (Patient not taking: Reported on 11/10/2023), Disp: 15 tablet, Rfl: 0   Allergies  Allergen Reactions   Penicillins Other (See Comments)    Has patient had a PCN reaction causing immediate rash, facial/tongue/throat swelling, SOB or lightheadedness with hypotension: Yes     Review of Systems  Constitutional:  Positive for fatigue. Negative for chills.  Respiratory: Negative.  Negative for chest tightness.   Cardiovascular:  Positive for leg swelling.  Gastrointestinal:  Negative for abdominal pain.  Musculoskeletal:  Positive for gait problem.  Skin:  Positive for color change and rash.       Bilateral lower extremity swelling with blisters     Today's Vitals   11/10/23 0934  BP: (!) 150/90  Pulse:  73  Temp: 98.1 F (36.7 C)  Weight: 220 lb (99.8 kg)   Body mass index is 38.97 kg/m.  Wt Readings from Last 3 Encounters:  11/10/23 220 lb 0.3 oz (99.8 kg)  11/10/23 220 lb (99.8 kg)  06/22/23 221 lb 1.9 oz (100.3 kg)    The ASCVD Risk score (Arnett DK, et al., 2019) failed to calculate for the following reasons:   The 2019 ASCVD risk score is only valid for ages 71 to 55  Objective:  Physical Exam Cardiovascular:     Rate and Rhythm: Normal rate.  Pulmonary:     Effort: Pulmonary effort is normal.  Abdominal:     General: Bowel sounds are normal.  Musculoskeletal:        General: Tenderness present.     Comments: Wheel chair bound  Skin:     Findings: Erythema and rash present.  Neurological:     Mental Status: She is alert. Mental status is at baseline.         Assessment And Plan:  Establishing care with new doctor, encounter for  Aortic atherosclerosis (HCC)  Chronic diastolic CHF (congestive heart failure) (HCC) -     CMP14+EGFR -     CBC with Differential/Platelet -     Brain natriuretic peptide -     Spironolactone; Take 1 tablet (25 mg total) by mouth daily.  Dispense: 30 tablet; Refill: 5 -     Furosemide; Take 1 tablet (40 mg total) by mouth daily.  Dispense: 30 tablet; Refill: 4 -     Metoprolol Succinate ER; Take 1 tablet (25 mg total) by mouth daily.  Dispense: 30 tablet; Refill: 4 -     AMB Referral [facility] Care Management  Cellulitis of lower extremity, unspecified laterality -     Doxycycline Hyclate; Take 1 tablet (100 mg total) by mouth 2 (two) times daily for 7 days.  Dispense: 14 tablet; Refill: 0  Hypothyroidism, unspecified type -     Levothyroxine Sodium; Take 1 tablet (100 mcg total) by mouth daily before breakfast.  Dispense: 30 tablet; Refill: 5 -     TSH    Return in 3 months (on 02/10/2024), or if symptoms worsen or fail to improve, for blood pressure.  Patient was given opportunity to ask questions. Patient verbalized understanding of the plan and was able to repeat key elements of the plan. All questions were answered to their satisfaction.   I, Traci Hose, NP, have reviewed all documentation for this visit. The documentation on 11/15/2023 for the exam, diagnosis, procedures, and orders are all accurate and complete.     IF YOU HAVE BEEN REFERRED TO A SPECIALIST, IT MAY TAKE 1-2 WEEKS TO SCHEDULE/PROCESS THE REFERRAL. IF YOU HAVE NOT HEARD FROM US/SPECIALIST IN TWO WEEKS, PLEASE GIVE [place] A CALL AT [phone].

## 2023-11-10 NOTE — ED Triage Notes (Signed)
Pt here with lower leg swelling. Pt has CHF and is compliant with meds. Denies CP/SOB. C/O pain to right leg.

## 2023-11-10 NOTE — ED Notes (Signed)
Ptar called eta not given but said she is 3rd on the list for pick up

## 2023-11-11 ENCOUNTER — Telehealth: Payer: Self-pay | Admitting: *Deleted

## 2023-11-11 LAB — CBC WITH DIFFERENTIAL/PLATELET
Basophils Absolute: 0.1 10*3/uL (ref 0.0–0.2)
Basos: 1 %
EOS (ABSOLUTE): 0.2 10*3/uL (ref 0.0–0.4)
Eos: 3 %
Hematocrit: 33.9 % — ABNORMAL LOW (ref 34.0–46.6)
Hemoglobin: 11.1 g/dL (ref 11.1–15.9)
Immature Grans (Abs): 0 10*3/uL (ref 0.0–0.1)
Immature Granulocytes: 0 %
Lymphocytes Absolute: 1.2 10*3/uL (ref 0.7–3.1)
Lymphs: 20 %
MCH: 31.4 pg (ref 26.6–33.0)
MCHC: 32.7 g/dL (ref 31.5–35.7)
MCV: 96 fL (ref 79–97)
Monocytes Absolute: 0.4 10*3/uL (ref 0.1–0.9)
Monocytes: 7 %
Neutrophils Absolute: 4.1 10*3/uL (ref 1.4–7.0)
Neutrophils: 69 %
Platelets: 371 10*3/uL (ref 150–450)
RBC: 3.53 x10E6/uL — ABNORMAL LOW (ref 3.77–5.28)
RDW: 14.6 % (ref 11.7–15.4)
WBC: 6 10*3/uL (ref 3.4–10.8)

## 2023-11-11 LAB — CMP14+EGFR
ALT: 10 [IU]/L (ref 0–32)
AST: 22 [IU]/L (ref 0–40)
Albumin: 4.3 g/dL (ref 3.6–4.6)
Alkaline Phosphatase: 122 [IU]/L — ABNORMAL HIGH (ref 44–121)
BUN/Creatinine Ratio: 21 (ref 12–28)
BUN: 16 mg/dL (ref 10–36)
Bilirubin Total: 0.3 mg/dL (ref 0.0–1.2)
CO2: 24 mmol/L (ref 20–29)
Calcium: 9.8 mg/dL (ref 8.7–10.3)
Chloride: 102 mmol/L (ref 96–106)
Creatinine, Ser: 0.76 mg/dL (ref 0.57–1.00)
Globulin, Total: 2.8 g/dL (ref 1.5–4.5)
Glucose: 90 mg/dL (ref 70–99)
Potassium: 4.3 mmol/L (ref 3.5–5.2)
Sodium: 144 mmol/L (ref 134–144)
Total Protein: 7.1 g/dL (ref 6.0–8.5)
eGFR: 74 mL/min/{1.73_m2} (ref >59)

## 2023-11-11 LAB — BRAIN NATRIURETIC PEPTIDE: BNP: 27.8 pg/mL (ref 0.0–100.0)

## 2023-11-11 LAB — TSH: TSH: 6.19 u[IU]/mL — ABNORMAL HIGH (ref 0.450–4.500)

## 2023-11-11 NOTE — Progress Notes (Signed)
  Care Coordination  Outreach Note  11/11/2023 Name: Traci Meyer MRN: [id number] DOB: 01-20-1931   Care Coordination Outreach Attempts: An unsuccessful telephone outreach was attempted today to offer the patient information about available care coordination services.  Follow Up Plan:  Additional outreach attempts will be made to offer the patient care coordination information and services.   Encounter Outcome:  No Answer  Gwenevere Ghazi  Care Coordination Care Guide  Direct Dial: [phone]

## 2023-11-12 NOTE — Progress Notes (Signed)
  Care Coordination  Outreach Note  11/12/2023 Name: NYLLA BUBIER MRN: [id number] DOB: 1931-04-09   Care Coordination Outreach Attempts: A second unsuccessful outreach was attempted today to offer the patient with information about available care coordination services.  Follow Up Plan:  Additional outreach attempts will be made to offer the patient care coordination information and services.   Encounter Outcome:  No Answer  Gwenevere Ghazi  Care Coordination Care Guide  Direct Dial: [phone]

## 2023-11-15 ENCOUNTER — Telehealth: Payer: Self-pay | Admitting: *Deleted

## 2023-11-15 NOTE — Progress Notes (Signed)
  Care Coordination  Outreach Note  11/15/2023 Name: Traci Meyer MRN: [id number] DOB: [birth year]   Care Coordination Outreach Attempts: A third unsuccessful outreach was attempted today to offer the patient with information about available care coordination services.  Follow Up Plan:  No further outreach attempts will be made at this time. We have been unable to contact the patient to offer or enroll patient in care coordination services Provider aware   Encounter Outcome:  No Answer  Gwenevere Ghazi  Care Coordination Care Guide  Direct Dial: [phone]

## 2023-11-18 NOTE — Progress Notes (Signed)
TSH was abnormal. I know daughter stated she has not been taking her meds, please it is recommend that she gets back on all her medications especially her Levothyroxine to help her TSH get back to normal.  Thanks!

## 2024-01-27 ENCOUNTER — Other Ambulatory Visit: Payer: Self-pay | Admitting: Family Medicine

## 2024-02-02 ENCOUNTER — Other Ambulatory Visit: Payer: Self-pay | Admitting: Family Medicine

## 2024-02-10 ENCOUNTER — Ambulatory Visit: Payer: Self-pay | Admitting: Family Medicine

## 2024-03-13 ENCOUNTER — Telehealth: Payer: Self-pay

## 2024-03-13 NOTE — Telephone Encounter (Signed)
 LVM for patient to call and  schedule appt. NO showed last appt.

## 2024-03-29 ENCOUNTER — Ambulatory Visit: Payer: Self-pay | Admitting: Family Medicine

## 2024-04-12 ENCOUNTER — Ambulatory Visit: Payer: Self-pay | Admitting: Family Medicine

## 2024-04-21 ENCOUNTER — Ambulatory Visit

## 2024-04-21 DIAGNOSIS — Z0000 Encounter for general adult medical examination without abnormal findings: Secondary | ICD-10-CM

## 2024-04-21 NOTE — Patient Instructions (Signed)

## 2024-04-21 NOTE — Progress Notes (Signed)
 Subjective:   Traci Meyer is a 89 y.o. female who presents for Medicare Annual (Subsequent) preventive examination.  Visit Complete: Virtual I connected with  Traci Meyer on 04/21/24 by a audio enabled telemedicine application and verified that I am speaking with the correct person using two identifiers.  Patient Location: Home  Provider Location: Home Office  I discussed the limitations of evaluation and management by telemedicine. The patient expressed understanding and agreed to proceed.  Vital Signs: Because this visit was a virtual/telehealth visit, some criteria may be missing or patient reported. Any vitals not documented were not able to be obtained and vitals that have been documented are patient reported.  Patient Medicare AWV questionnaire was completed by the patient on 04/21/24; I have confirmed that all information answered by patient is correct and no changes since this date.  Cardiac Risk Factors include: hypertension     Objective:    Today's Vitals   There is no height or weight on file to calculate BMI.     11/10/2023   12:09 PM 06/19/2023    6:02 AM 01/20/2018    2:22 PM 08/25/2017    8:51 AM 08/25/2017    1:42 AM 08/03/2017    8:23 AM 08/01/2017    2:36 PM  Advanced Directives  Does Patient Have a Medical Advance Directive? No Yes No No No No No  Type of Advance Directive  Healthcare Power of Attorney       Does patient want to make changes to medical advance directive?  No - Patient declined       Copy of Healthcare Power of Attorney in Chart?  No - copy requested       Would patient like information on creating a medical advance directive?   Yes (ED - Information included in AVS) No - Patient declined  No - Patient declined     Current Medications (verified) Outpatient Encounter Medications as of 04/21/2024  Medication Sig   levothyroxine  (SYNTHROID ) 100 MCG tablet Take 1 tablet (100 mcg total) by mouth daily before breakfast.   metoprolol   succinate (TOPROL -XL) 25 MG 24 hr tablet Take 1 tablet (25 mg total) by mouth daily.   spironolactone  (ALDACTONE ) 25 MG tablet Take 1 tablet (25 mg total) by mouth daily.   acetaminophen  (TYLENOL ) 500 MG tablet Take 1,000 mg by mouth every 6 (six) hours as needed for mild pain. (Patient not taking: Reported on 11/10/2023)   diphenhydrAMINE (BENADRYL) 25 mg capsule Take 50 mg by mouth every 6 (six) hours as needed for itching or sleep. (Patient not taking: Reported on 11/10/2023)   furosemide  (LASIX ) 40 MG tablet Take 1 tablet (40 mg total) by mouth daily.   oxybutynin  (DITROPAN ) 5 MG tablet Take 5 mg by mouth daily. (Patient not taking: Reported on 11/10/2023)   rosuvastatin  (CRESTOR ) 10 MG tablet Take 10 mg by mouth daily. (Patient not taking: Reported on 11/10/2023)   traMADol  (ULTRAM ) 50 MG tablet Take 1 tablet (50 mg total) by mouth every 6 (six) hours as needed. (Patient not taking: Reported on 11/10/2023)   No facility-administered encounter medications on file as of 04/21/2024.    Allergies (verified) Penicillins   History: Past Medical History:  Diagnosis Date   Aortic atherosclerosis (HCC)    Arthritis    Diastolic congestive heart failure (HCC)    Hypertension    Hypothyroidism    Past Surgical History:  Procedure Laterality Date   MULTIPLE TOOTH EXTRACTIONS     Family History  Problem Relation Age of Onset   Heart disease Neg Hx    Social History   Socioeconomic History   Marital status: Single    Spouse name: Not on file   Number of children: Not on file   Years of education: Not on file   Highest education level: Not on file  Occupational History   Not on file  Tobacco Use   Smoking status: Never    Passive exposure: Never   Smokeless tobacco: Never   Tobacco comments:    45 years ago briefly  Vaping Use   Vaping status: Never Used  Substance and Sexual Activity   Alcohol use: Not Currently   Drug use: No   Sexual activity: Not on file  Other Topics  Concern   Not on file  Social History Narrative   Not on file   Social Drivers of Health   Financial Resource Strain: Medium Risk (04/21/2024)   Overall Financial Resource Strain (CARDIA)    Difficulty of Paying Living Expenses: Somewhat hard  Food Insecurity: No Food Insecurity (04/21/2024)   Hunger Vital Sign    Worried About Running Out of Food in the Last Year: Never true    Ran Out of Food in the Last Year: Never true  Transportation Needs: Unmet Transportation Needs (04/21/2024)   PRAPARE - Administrator, Civil Service (Medical): Yes    Lack of Transportation (Non-Medical): No  Physical Activity: Inactive (04/21/2024)   Exercise Vital Sign    Days of Exercise per Week: 0 days    Minutes of Exercise per Session: 0 min  Stress: No Stress Concern Present (04/21/2024)   [employer] of Occupational Health - Occupational Stress Questionnaire    Feeling of Stress : Only a little  Social Connections: Unknown (04/21/2024)   Social Connection and Isolation Panel [NHANES]    Frequency of Communication with Friends and Family: Twice a week    Frequency of Social Gatherings with Friends and Family: Once a week    Attends Religious Services: More than 4 times per year    Active Member of [employer] or Organizations: No    Attends Engineer, structural: Never    Marital Status: Patient unable to answer    Tobacco Counseling Counseling given: Not Answered Tobacco comments: 45 years ago briefly   Clinical Intake:  Pre-visit preparation completed: Yes  Pain : No/denies pain     Nutritional Risks: None Diabetes: No  How often do you need to have someone help you when you read instructions, pamphlets, or other written materials from your doctor or pharmacy?: 4 - Often What is the last grade level you completed in school?: 12th grade  Interpreter Needed?: No  Information entered by :: [place] [facility]   Activities of Daily Living    04/21/2024    9:50 AM  06/19/2023    6:02 AM  In your present state of health, do you have any difficulty performing the following activities:  Hearing? 0 0  Vision? 1 0  Difficulty concentrating or making decisions? 0 0  Walking or climbing stairs? 1 1  Dressing or bathing? 0 1  Doing errands, shopping? 0 1  Preparing Food and eating ? Y   Using the Toilet? N   In the past six months, have you accidently leaked urine? Y   Do you have problems with loss of bowel control? N   Managing your Medications? Y   Managing your Finances? Y   Housekeeping or managing  your Housekeeping? Y     Patient Care Team: Melodie Spry, NP as PCP - General (Family Medicine) Jann Melody, MD as PCP - Cardiology (Cardiology)  Indicate any recent Medical Services you may have received from other than [facility] providers in the past year (date may be approximate).     Assessment:   This is a routine wellness examination for Keylah.  Hearing/Vision screen No results found.   Goals Addressed               This Visit's Progress     Get Rid Of Pains (pt-stated)        Move around more. Walk.       Depression Screen    04/21/2024    9:57 AM 04/21/2024    9:53 AM 11/10/2023    9:32 AM  PHQ 2/9 Scores  PHQ - 2 Score 0 0 0  PHQ- 9 Score   0    Fall Risk    04/21/2024    9:53 AM  Fall Risk   Falls in the past year? 0  Number falls in past yr: 0  Injury with Fall? 0  Risk for fall due to : No Fall Risks  Follow up Falls evaluation completed    MEDICARE RISK AT HOME: Medicare Risk at Home Any stairs in or around the home?: No If so, are there any without handrails?: No Home free of loose throw rugs in walkways, pet beds, electrical cords, etc?: Yes Adequate lighting in your home to reduce risk of falls?: Yes Life alert?: No Use of a cane, walker or w/c?: Yes Grab bars in the bathroom?: Yes Shower chair or bench in shower?: Yes Elevated toilet seat or a handicapped toilet?: Yes   Cognitive  Function:        04/21/2024    9:55 AM  6CIT Screen  What Year? 0 points  What month? 0 points  What time? 0 points  Count back from 20 2 points  Months in reverse 4 points  Repeat phrase 6 points  Total Score 12 points    Immunizations  There is no immunization history on file for this patient.  TDAP status: Due, Education has been provided regarding the importance of this vaccine. Advised may receive this vaccine at local pharmacy or Health Dept. Aware to provide a copy of the vaccination record if obtained from local pharmacy or Health Dept. Verbalized acceptance and understanding.  Flu Vaccine status: Up to date  Pneumococcal vaccine status: Due, Education has been provided regarding the importance of this vaccine. Advised may receive this vaccine at local pharmacy or Health Dept. Aware to provide a copy of the vaccination record if obtained from local pharmacy or Health Dept. Verbalized acceptance and understanding.  Covid-19 vaccine status: Information provided on how to obtain vaccines.   Qualifies for Shingles Vaccine? Yes   Zostavax completed No   Shingrix Completed?: No.    Education has been provided regarding the importance of this vaccine. Patient has been advised to call insurance company to determine out of pocket expense if they have not yet received this vaccine. Advised may also receive vaccine at local pharmacy or Health Dept. Verbalized acceptance and understanding.  Screening Tests Health Maintenance  Topic Date Due   DTaP/Tdap/Td (1 - Tdap) Never done   Pneumonia Vaccine 21+ Years old (1 of 2 - PCV) Never done   Zoster Vaccines- Shingrix (1 of 2) Never done   DEXA SCAN  Never done  COVID-19 Vaccine (1 - 2024-25 season) Never done   INFLUENZA VACCINE  07/28/2024   Medicare Annual Wellness (AWV)  04/21/2025   HPV VACCINES  Aged Out   Meningococcal B Vaccine  Aged Out    Health Maintenance  Health Maintenance Due  Topic Date Due   DTaP/Tdap/Td (1 -  Tdap) Never done   Pneumonia Vaccine 55+ Years old (1 of 2 - PCV) Never done   Zoster Vaccines- Shingrix (1 of 2) Never done   DEXA SCAN  Never done   COVID-19 Vaccine (1 - 2024-25 season) Never done    Additional Screening:   Dental Screening: Recommended annual dental exams for proper oral hygiene   Community Resource Referral / Chronic Care Management: CRR required this visit?  No   CCM required this visit?  No     Plan:     I have personally reviewed and noted the following in the patient's chart:   Medical and social history Use of alcohol, tobacco or illicit drugs  Current medications and supplements including opioid prescriptions. Patient is not currently taking opioid prescriptions. Functional ability and status Nutritional status Physical activity Advanced directives List of other physicians Hospitalizations, surgeries, and ER visits in previous 12 months Vitals Screenings to include cognitive, depression, and falls Referrals and appointments  In addition, I have reviewed and discussed with patient certain preventive protocols, quality metrics, and best practice recommendations. A written personalized care plan for preventive services as well as general preventive health recommendations were provided to patient.     Edwinna Grammes, CMA   04/21/2024   After Visit Summary: (MyChart) Due to this being a telephonic visit, the after visit summary with patients personalized plan was offered to patient via MyChart   Nurse Notes: Adan Holms

## 2024-04-26 ENCOUNTER — Ambulatory Visit (INDEPENDENT_AMBULATORY_CARE_PROVIDER_SITE_OTHER): Payer: Self-pay | Admitting: Family Medicine

## 2024-04-26 ENCOUNTER — Encounter: Payer: Self-pay | Admitting: Family Medicine

## 2024-04-26 VITALS — BP 122/80 | HR 97 | Temp 98.1°F

## 2024-04-26 DIAGNOSIS — Z6841 Body Mass Index (BMI) 40.0 and over, adult: Secondary | ICD-10-CM

## 2024-04-26 DIAGNOSIS — I700 Atherosclerosis of aorta: Secondary | ICD-10-CM | POA: Diagnosis not present

## 2024-04-26 DIAGNOSIS — I110 Hypertensive heart disease with heart failure: Secondary | ICD-10-CM

## 2024-04-26 DIAGNOSIS — I480 Paroxysmal atrial fibrillation: Secondary | ICD-10-CM | POA: Insufficient documentation

## 2024-04-26 DIAGNOSIS — E66813 Obesity, class 3: Secondary | ICD-10-CM

## 2024-04-26 DIAGNOSIS — I5032 Chronic diastolic (congestive) heart failure: Secondary | ICD-10-CM

## 2024-04-26 DIAGNOSIS — I10 Essential (primary) hypertension: Secondary | ICD-10-CM

## 2024-04-26 DIAGNOSIS — E8882 Obesity due to disruption of MC4R pathway: Secondary | ICD-10-CM

## 2024-04-26 DIAGNOSIS — R32 Unspecified urinary incontinence: Secondary | ICD-10-CM

## 2024-04-26 DIAGNOSIS — I5033 Acute on chronic diastolic (congestive) heart failure: Secondary | ICD-10-CM

## 2024-04-26 DIAGNOSIS — R3981 Functional urinary incontinence: Secondary | ICD-10-CM

## 2024-04-26 DIAGNOSIS — Z23 Encounter for immunization: Secondary | ICD-10-CM | POA: Diagnosis not present

## 2024-04-26 DIAGNOSIS — E039 Hypothyroidism, unspecified: Secondary | ICD-10-CM

## 2024-04-26 DIAGNOSIS — N319 Neuromuscular dysfunction of bladder, unspecified: Secondary | ICD-10-CM

## 2024-04-26 DIAGNOSIS — Z152 Genetic susceptibility to obesity: Secondary | ICD-10-CM

## 2024-04-26 DIAGNOSIS — J849 Interstitial pulmonary disease, unspecified: Secondary | ICD-10-CM | POA: Insufficient documentation

## 2024-04-26 MED ORDER — LEVOTHYROXINE SODIUM 100 MCG PO TABS: 100.0000 ug | ORAL_TABLET | Freq: Every day | ORAL | 1 refills | Status: DC

## 2024-04-26 MED ORDER — SPIRONOLACTONE 25 MG PO TABS: 25.0000 mg | ORAL_TABLET | Freq: Every day | ORAL | 1 refills | Status: DC

## 2024-04-26 MED ORDER — METOPROLOL SUCCINATE ER 25 MG PO TB24: 25.0000 mg | ORAL_TABLET | Freq: Every day | ORAL | 1 refills | Status: DC

## 2024-04-26 MED ORDER — FUROSEMIDE 40 MG PO TABS: 40.0000 mg | ORAL_TABLET | Freq: Every day | ORAL | 1 refills | Status: DC

## 2024-04-26 NOTE — Progress Notes (Addendum)
 I,Jameka J Llittleton, CMA,acting as a Neurosurgeon for [employer], NP.,have documented all relevant documentation on the behalf of Melodie Spry, NP,as directed by  Melodie Spry, NP while in the presence of Melodie Spry, NP.  Subjective:  Patient ID: Traci Meyer , female    DOB: [birth year] , 89 y.o.   MRN: [id number]  Chief Complaint  Patient presents with   Hypertension    HPI  Patient is a 89 year old female who was brought into the office today for her 3 months follow up by her daughter who is her current caregiver. Patient has a diagnosis of congestive heart failure, incontinence, hypothyroidism . Daughter cannot remember when last she saw her Cardiologist and she thinks her mum may have run out of medication for about a few days. 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     Past Medical History:  Diagnosis Date   Aortic atherosclerosis (HCC)    Arthritis    Diastolic congestive heart failure (HCC)    Hypertension    Hypothyroidism      Family History  Problem Relation Age of Onset   Heart disease Neg Hx      Current Outpatient Medications:    acetaminophen  (TYLENOL ) 500 MG tablet, Take 1,000 mg by mouth every 6 (six) hours as needed for mild pain. (Patient not taking: Reported on 11/10/2023), Disp: , Rfl:    diphenhydrAMINE (BENADRYL) 25 mg capsule, Take 50 mg by mouth every 6 (six) hours as needed for itching or sleep. (Patient not taking: Reported on 11/10/2023), Disp: , Rfl:    furosemide  (LASIX ) 40 MG tablet, Take 1 tablet (40 mg total) by mouth daily., Disp: 90 tablet, Rfl: 1   levothyroxine  (SYNTHROID ) 100 MCG tablet, Take 1 tablet (100 mcg total) by mouth daily before breakfast., Disp: 90 tablet, Rfl: 1   metoprolol  succinate (TOPROL -XL) 25 MG 24 hr tablet, Take 1 tablet (25 mg total) by mouth daily., Disp: 90 tablet, Rfl: 1   oxybutynin  (DITROPAN )  5 MG tablet, Take 5 mg by mouth daily. (Patient not taking: Reported on 11/10/2023), Disp: , Rfl:    rosuvastatin  (CRESTOR ) 10 MG tablet, Take 10 mg by mouth daily. (Patient not taking: Reported on 11/10/2023), Disp: , Rfl:    spironolactone  (ALDACTONE ) 25 MG tablet, Take 1 tablet (25 mg total) by mouth daily., Disp: 90 tablet, Rfl: 1   traMADol  (ULTRAM ) 50 MG tablet, Take 1 tablet (50 mg total) by mouth every 6 (six) hours as needed. (Patient not taking: Reported on 11/10/2023), Disp: 15 tablet, Rfl: 0   Allergies  Allergen Reactions   Penicillins Other (See Comments)    Has patient had a PCN reaction causing immediate rash, facial/tongue/throat swelling, SOB or lightheadedness with hypotension: Yes     Review of Systems  Constitutional: Negative.   HENT: Negative.    Eyes: Negative.   Cardiovascular: Negative.   Gastrointestinal: Negative.   Genitourinary:        Incontinent of urine/bowels uses pull ups  Musculoskeletal:  Positive for gait problem.       Can stand and take 2-3 steps but uses the wheelchair for ambulation     Today's Vitals   04/26/24 1031  BP: 122/80  Pulse: 97  Temp: 98.1 F (36.7 C)  TempSrc: Oral  PainSc: 6   PainLoc: Knee   There is no height or weight on  file to calculate BMI.  Wt Readings from Last 3 Encounters:  11/10/23 220 lb 0.3 oz (99.8 kg)  11/10/23 220 lb (99.8 kg)  06/22/23 221 lb 1.9 oz (100.3 kg)    The ASCVD Risk score (Arnett DK, et al., 2019) failed to calculate for the following reasons:   The 2019 ASCVD risk score is only valid for ages 50 to 68  Objective:  Physical Exam Constitutional:      Appearance: She is obese.  HENT:     Head: Normocephalic.  Cardiovascular:     Rate and Rhythm: Normal rate and regular rhythm.  Pulmonary:     Breath sounds: Examination of the left-middle field reveals rales. Rales present.  Neurological:     Mental Status: She is alert.         Assessment And Plan:  Aortic atherosclerosis  (HCC) -     Lipid panel  Hypertension, unspecified type -     CBC -     CMP14+EGFR  Chronic diastolic CHF (congestive heart failure) (HCC) -     Spironolactone ; Take 1 tablet (25 mg total) by mouth daily.  Dispense: 90 tablet; Refill: 1 -     Furosemide ; Take 1 tablet (40 mg total) by mouth daily.  Dispense: 90 tablet; Refill: 1 -     Metoprolol  Succinate ER; Take 1 tablet (25 mg total) by mouth daily.  Dispense: 90 tablet; Refill: 1  Hypothyroidism, unspecified type -     TSH + free T4 -     Levothyroxine  Sodium; Take 1 tablet (100 mcg total) by mouth daily before breakfast.  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 Advised to aim for at least 150 minutes of exercise per week.      Return in about 5 months (around 09/26/2024) for  blood pressure.  Patient was given opportunity to ask questions. Patient verbalized understanding of the plan and was able to repeat key elements of the plan. All questions were answered to their satisfaction.    I, Melodie Spry, NP, have reviewed all documentation for this visit. The documentation on 05/01/2024 for the exam, diagnosis, procedures, and orders are all accurate and complete.   IF YOU HAVE BEEN REFERRED TO A SPECIALIST, IT MAY TAKE 1-2 WEEKS TO SCHEDULE/PROCESS THE REFERRAL. IF YOU HAVE NOT HEARD FROM US /SPECIALIST IN TWO WEEKS, PLEASE GIVE US  A CALL AT [phone].

## 2024-04-26 NOTE — Patient Instructions (Signed)
 Hypertension, Adult Hypertension is another name for high blood pressure. High blood pressure forces your heart to work harder to pump blood. This can cause problems over time. There are two numbers in a blood pressure reading. There is a top number (systolic) over a bottom number (diastolic). It is best to have a blood pressure that is below 120/80. What are the causes? The cause of this condition is not known. Some other conditions can lead to high blood pressure. What increases the risk? Some lifestyle factors can make you more likely to develop high blood pressure: Smoking. Not getting enough exercise or physical activity. Being overweight. Having too much fat, sugar, calories, or salt (sodium) in your diet. Drinking too much alcohol. Other risk factors include: Having any of these conditions: Heart disease. Diabetes. High cholesterol. Kidney disease. Obstructive sleep apnea. Having a family history of high blood pressure and high cholesterol. Age. The risk increases with age. Stress. What are the signs or symptoms? High blood pressure may not cause symptoms. Very high blood pressure (hypertensive crisis) may cause: Headache. Fast or uneven heartbeats (palpitations). Shortness of breath. Nosebleed. Vomiting or feeling like you may vomit (nauseous). Changes in how you see. Very bad chest pain. Feeling dizzy. Seizures. How is this treated? This condition is treated by making healthy lifestyle changes, such as: Eating healthy foods. Exercising more. Drinking less alcohol. Your doctor may prescribe medicine if lifestyle changes do not help enough and if: Your top number is above 130. Your bottom number is above 80. Your personal target blood pressure may vary. Follow these instructions at home: Eating and drinking  If told, follow the DASH eating plan. To follow this plan: Fill one half of your plate at each meal with fruits and vegetables. Fill one fourth of your plate  at each meal with whole grains. Whole grains include whole-wheat pasta, brown rice, and whole-grain bread. Eat or drink low-fat dairy products, such as skim milk or low-fat yogurt. Fill one fourth of your plate at each meal with low-fat (lean) proteins. Low-fat proteins include fish, chicken without skin, eggs, beans, and tofu. Avoid fatty meat, cured and processed meat, or chicken with skin. Avoid pre-made or processed food. Limit the amount of salt in your diet to less than 1,500 mg each day. Do not drink alcohol if: Your doctor tells you not to drink. You are pregnant, may be pregnant, or are planning to become pregnant. If you drink alcohol: Limit how much you have to: 0-1 drink a day for women. 0-2 drinks a day for men. Know how much alcohol is in your drink. In the [place] one drink equals one 12 oz bottle of beer (355 mL), one 5 oz glass of wine (148 mL), or one 1 oz glass of hard liquor (44 mL). Lifestyle  Work with your doctor to stay at a healthy weight or to lose weight. Ask your doctor what the best weight is for you. Get at least 30 minutes of exercise that causes your heart to beat faster (aerobic exercise) most days of the week. This may include walking, swimming, or biking. Get at least 30 minutes of exercise that strengthens your muscles (resistance exercise) at least 3 days a week. This may include lifting weights or doing Pilates. Do not smoke or use any products that contain nicotine or tobacco. If you need help quitting, ask your doctor. Check your blood pressure at home as told by your doctor. Keep all follow-up visits. Medicines Take over-the-counter and prescription medicines  only as told by your doctor. Follow directions carefully. Do not skip doses of blood pressure medicine. The medicine does not work as well if you skip doses. Skipping doses also puts you at risk for problems. Ask your doctor about side effects or reactions to medicines that you should watch  for. Contact a doctor if: You think you are having a reaction to the medicine you are taking. You have headaches that keep coming back. You feel dizzy. You have swelling in your ankles. You have trouble with your vision. Get help right away if: You get a very bad headache. You start to feel mixed up (confused). You feel weak or numb. You feel faint. You have very bad pain in your: Chest. Belly (abdomen). You vomit more than once. You have trouble breathing. These symptoms may be an emergency. Get help right away. Call 911. Do not wait to see if the symptoms will go away. Do not drive yourself to the hospital. Summary Hypertension is another name for high blood pressure. High blood pressure forces your heart to work harder to pump blood. For most people, a normal blood pressure is less than 120/80. Making healthy choices can help lower blood pressure. If your blood pressure does not get lower with healthy choices, you may need to take medicine. This information is not intended to replace advice given to you by your health care provider. Make sure you discuss any questions you have with your health care provider. Document Revised: 10/02/2021 Document Reviewed: 10/02/2021 Elsevier Patient Education  2024 [employer].

## 2024-04-27 LAB — LIPID PANEL
Chol/HDL Ratio: 3.9 ratio (ref 0.0–4.4)
Cholesterol, Total: 284 mg/dL — ABNORMAL HIGH (ref 100–199)
HDL: 73 mg/dL (ref >39)
LDL Chol Calc (NIH): 196 mg/dL — ABNORMAL HIGH (ref 0–99)
Triglycerides: 90 mg/dL (ref 0–149)
VLDL Cholesterol Cal: 15 mg/dL (ref 5–40)

## 2024-04-27 LAB — CBC
Hematocrit: 34.9 % (ref 34.0–46.6)
Hemoglobin: 11.4 g/dL (ref 11.1–15.9)
MCH: 31.2 pg (ref 26.6–33.0)
MCHC: 32.7 g/dL (ref 31.5–35.7)
MCV: 96 fL (ref 79–97)
Platelets: 323 10*3/uL (ref 150–450)
RBC: 3.65 x10E6/uL — ABNORMAL LOW (ref 3.77–5.28)
RDW: 14 % (ref 11.7–15.4)
WBC: 5.3 10*3/uL (ref 3.4–10.8)

## 2024-04-27 LAB — CMP14+EGFR
ALT: 10 IU/L (ref 0–32)
AST: 20 IU/L (ref 0–40)
Albumin: 4.3 g/dL (ref 3.6–4.6)
Alkaline Phosphatase: 91 IU/L (ref 44–121)
BUN/Creatinine Ratio: 26 (ref 12–28)
BUN: 18 mg/dL (ref 10–36)
Bilirubin Total: 0.4 mg/dL (ref 0.0–1.2)
CO2: 24 mmol/L (ref 20–29)
Calcium: 9.8 mg/dL (ref 8.7–10.3)
Chloride: 100 mmol/L (ref 96–106)
Creatinine, Ser: 0.68 mg/dL (ref 0.57–1.00)
Globulin, Total: 2.5 g/dL (ref 1.5–4.5)
Glucose: 84 mg/dL (ref 70–99)
Potassium: 4.6 mmol/L (ref 3.5–5.2)
Sodium: 139 mmol/L (ref 134–144)
Total Protein: 6.8 g/dL (ref 6.0–8.5)
eGFR: 82 mL/min/{1.73_m2} (ref >59)

## 2024-04-27 LAB — TSH+FREE T4
Free T4: 0.99 ng/dL (ref 0.82–1.77)
TSH: 4.26 u[IU]/mL (ref 0.450–4.500)

## 2024-05-01 DIAGNOSIS — Z23 Encounter for immunization: Secondary | ICD-10-CM | POA: Insufficient documentation

## 2024-05-01 DIAGNOSIS — R32 Unspecified urinary incontinence: Secondary | ICD-10-CM | POA: Insufficient documentation

## 2024-05-01 DIAGNOSIS — I5033 Acute on chronic diastolic (congestive) heart failure: Secondary | ICD-10-CM | POA: Insufficient documentation

## 2024-05-01 DIAGNOSIS — E66813 Obesity, class 3: Secondary | ICD-10-CM | POA: Insufficient documentation

## 2024-05-01 NOTE — Assessment & Plan Note (Signed)
 She is encouraged to strive for BMI less than 30 to decrease cardiac risk. Advised to aim for at least 150 minutes of exercise per week.

## 2024-05-01 NOTE — Assessment & Plan Note (Signed)
 Order for pull ups, wipes etc

## 2024-05-01 NOTE — Progress Notes (Signed)
 Cholesterol elevated. Low fat diet advised  Thanks

## 2024-05-23 ENCOUNTER — Other Ambulatory Visit: Payer: Self-pay | Admitting: Family Medicine

## 2024-05-31 DIAGNOSIS — N319 Neuromuscular dysfunction of bladder, unspecified: Secondary | ICD-10-CM | POA: Insufficient documentation

## 2024-07-26 ENCOUNTER — Other Ambulatory Visit: Payer: Self-pay | Admitting: Family Medicine

## 2024-07-26 DIAGNOSIS — I5032 Chronic diastolic (congestive) heart failure: Secondary | ICD-10-CM

## 2024-07-26 DIAGNOSIS — E039 Hypothyroidism, unspecified: Secondary | ICD-10-CM

## 2024-07-26 MED ORDER — METOPROLOL SUCCINATE ER 25 MG PO TB24
25.0000 mg | ORAL_TABLET | Freq: Every day | ORAL | 1 refills | Status: AC
Start: 2024-07-26 — End: ?

## 2024-07-26 MED ORDER — SPIRONOLACTONE 25 MG PO TABS
25.0000 mg | ORAL_TABLET | Freq: Every day | ORAL | 1 refills | Status: AC
Start: 2024-07-26 — End: ?

## 2024-07-26 MED ORDER — LEVOTHYROXINE SODIUM 100 MCG PO TABS
100.0000 ug | ORAL_TABLET | Freq: Every day | ORAL | 1 refills | Status: AC
Start: 2024-07-26 — End: ?

## 2024-07-26 MED ORDER — FUROSEMIDE 40 MG PO TABS
40.0000 mg | ORAL_TABLET | Freq: Every day | ORAL | 1 refills | Status: AC
Start: 2024-07-26 — End: ?

## 2024-07-26 NOTE — Telephone Encounter (Signed)
 Copied from CRM [id number]. Topic: Clinical - Medication Refill >> Jul 26, 2024  3:48 PM Fonda T wrote: Medication:  furosemide  (LASIX ) 40 MG tablet  levothyroxine  (SYNTHROID ) 100 MCG tablet  metoprolol  succinate (TOPROL -XL) 25 MG 24 hr tablet  spironolactone  (ALDACTONE ) 25 MG tablet  Requesting 90 day supply of all medications   Has the patient contacted their pharmacy? Yes, per pharmacy advised to contact office to request refill  This is the patient's preferred pharmacy:  [id number] - [place], [place] - [address] [place] [address] Phone: [phone] Fax: [phone]   Is this the correct pharmacy for this prescription? Yes If no, delete pharmacy and type the correct one.   Has the prescription been filled recently? Yes  Is the patient out of the medication? Yes  Has the patient been seen for an appointment in the last year OR does the patient have an upcoming appointment? Yes  Can we respond through MyChart? No  Agent: Please be advised that Rx refills may take up to 3 business days. We ask that you follow-up with your pharmacy.

## 2024-09-27 ENCOUNTER — Ambulatory Visit: Admitting: Family Medicine

## 2024-11-10 ENCOUNTER — Ambulatory Visit: Payer: Self-pay | Admitting: Family Medicine

## 2024-11-30 ENCOUNTER — Ambulatory Visit: Payer: Self-pay | Admitting: Family Medicine

## 2025-05-23 ENCOUNTER — Ambulatory Visit: Payer: Self-pay
